# Patient Record
Sex: Male | Born: 1940 | Race: White | Hispanic: No | Marital: Married | Smoking: Former smoker
Health system: Southern US, Community
[De-identification: ages and names within clinical notes are randomized; demographics above are authoritative.]

## PROBLEM LIST (undated history)

## (undated) DIAGNOSIS — C4492 Squamous cell carcinoma of skin, unspecified: Secondary | ICD-10-CM

## (undated) DIAGNOSIS — I4891 Unspecified atrial fibrillation: Secondary | ICD-10-CM

## (undated) DIAGNOSIS — E7800 Pure hypercholesterolemia, unspecified: Secondary | ICD-10-CM

## (undated) DIAGNOSIS — E785 Hyperlipidemia, unspecified: Secondary | ICD-10-CM

## (undated) DIAGNOSIS — T4145XA Adverse effect of unspecified anesthetic, initial encounter: Secondary | ICD-10-CM

## (undated) DIAGNOSIS — T8859XA Other complications of anesthesia, initial encounter: Secondary | ICD-10-CM

## (undated) DIAGNOSIS — I10 Essential (primary) hypertension: Secondary | ICD-10-CM

## (undated) DIAGNOSIS — I350 Nonrheumatic aortic (valve) stenosis: Secondary | ICD-10-CM

## (undated) DIAGNOSIS — G3184 Mild cognitive impairment, so stated: Secondary | ICD-10-CM

## (undated) DIAGNOSIS — B029 Zoster without complications: Secondary | ICD-10-CM

## (undated) HISTORY — DX: Unspecified atrial fibrillation: I48.91

## (undated) HISTORY — PX: KNEE SURGERY: SHX244

## (undated) HISTORY — DX: Squamous cell carcinoma of skin, unspecified: C44.92

## (undated) HISTORY — DX: Pure hypercholesterolemia, unspecified: E78.00

## (undated) HISTORY — DX: Zoster without complications: B02.9

## (undated) HISTORY — DX: Nonrheumatic aortic (valve) stenosis: I35.0

## (undated) HISTORY — PX: OTHER SURGICAL HISTORY: SHX169

## (undated) HISTORY — DX: Hyperlipidemia, unspecified: E78.5

## (undated) HISTORY — DX: Essential (primary) hypertension: I10

## (undated) HISTORY — PX: CARDIAC CATHETERIZATION: SHX172

---

## 1999-03-25 ENCOUNTER — Ambulatory Visit (HOSPITAL_COMMUNITY): Admission: RE | Admit: 1999-03-25 | Discharge: 1999-03-25 | Payer: Self-pay | Admitting: Gastroenterology

## 1999-11-12 ENCOUNTER — Encounter: Payer: Self-pay | Admitting: Orthopedic Surgery

## 1999-11-12 ENCOUNTER — Encounter: Admission: RE | Admit: 1999-11-12 | Discharge: 1999-11-12 | Payer: Self-pay | Admitting: Orthopedic Surgery

## 2004-08-02 ENCOUNTER — Encounter (INDEPENDENT_AMBULATORY_CARE_PROVIDER_SITE_OTHER): Payer: Self-pay | Admitting: Specialist

## 2004-08-02 ENCOUNTER — Ambulatory Visit (HOSPITAL_COMMUNITY): Admission: RE | Admit: 2004-08-02 | Discharge: 2004-08-02 | Payer: Self-pay | Admitting: Gastroenterology

## 2006-07-11 HISTORY — PX: AORTIC VALVE REPLACEMENT: SHX41

## 2007-05-14 ENCOUNTER — Ambulatory Visit (HOSPITAL_COMMUNITY): Admission: RE | Admit: 2007-05-14 | Discharge: 2007-05-14 | Payer: Self-pay | Admitting: Cardiology

## 2007-05-18 ENCOUNTER — Ambulatory Visit: Payer: Self-pay | Admitting: Cardiothoracic Surgery

## 2007-05-21 ENCOUNTER — Encounter: Payer: Self-pay | Admitting: Cardiothoracic Surgery

## 2007-05-23 ENCOUNTER — Encounter: Payer: Self-pay | Admitting: Cardiothoracic Surgery

## 2007-05-23 ENCOUNTER — Inpatient Hospital Stay (HOSPITAL_COMMUNITY): Admission: RE | Admit: 2007-05-23 | Discharge: 2007-05-29 | Payer: Self-pay | Admitting: Cardiothoracic Surgery

## 2007-05-23 ENCOUNTER — Ambulatory Visit: Payer: Self-pay | Admitting: Cardiothoracic Surgery

## 2007-06-29 ENCOUNTER — Ambulatory Visit: Payer: Self-pay | Admitting: Cardiothoracic Surgery

## 2007-06-29 ENCOUNTER — Encounter: Admission: RE | Admit: 2007-06-29 | Discharge: 2007-06-29 | Payer: Self-pay | Admitting: Cardiothoracic Surgery

## 2007-07-13 ENCOUNTER — Encounter (HOSPITAL_COMMUNITY): Admission: RE | Admit: 2007-07-13 | Discharge: 2007-10-11 | Payer: Self-pay | Admitting: Cardiology

## 2009-07-18 IMAGING — CR DG CHEST 1V PORT
2 series · 2 of 2 positions shown · non-contrast
Comparison: 05/24/07.

CLINICAL DATA: Aortic stenosis. 
 PORTABLE CHEST - 1 VIEW ? 05/24/07 AT 2999 HOURS:

[AP (1 of 2)]
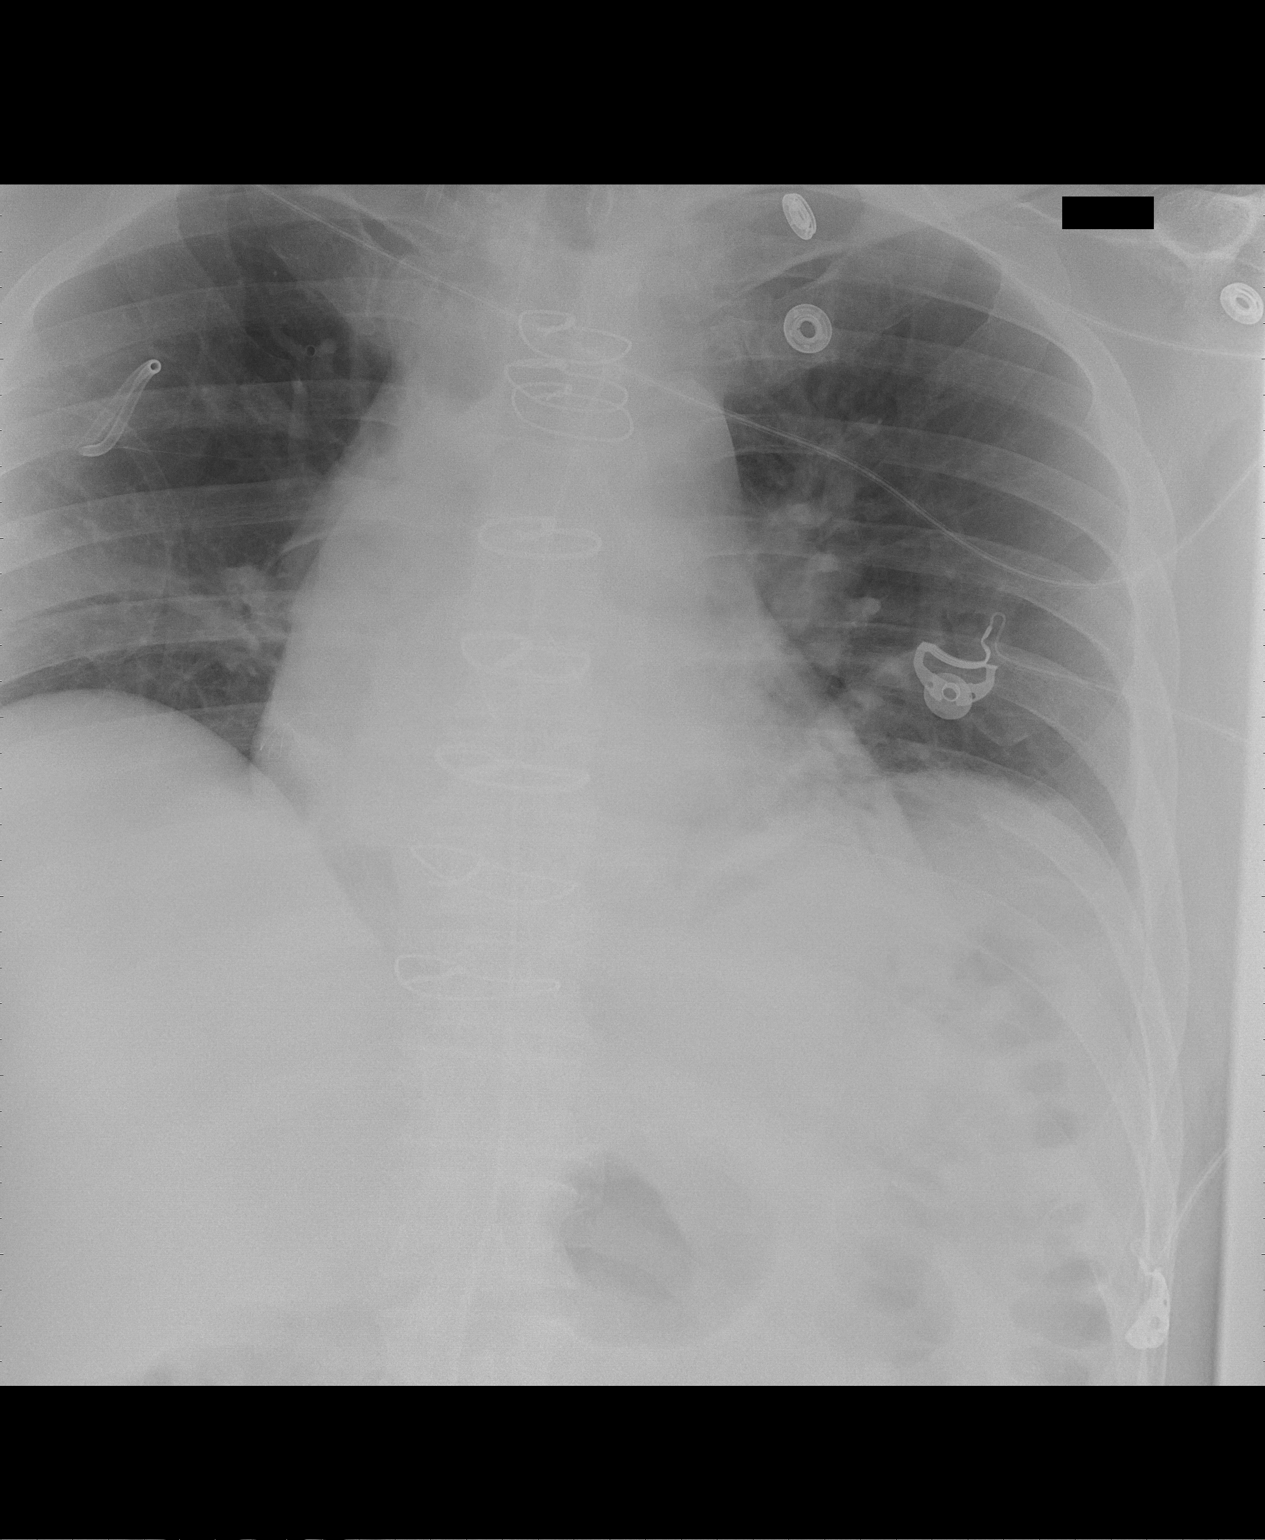

[AP (2 of 2)]
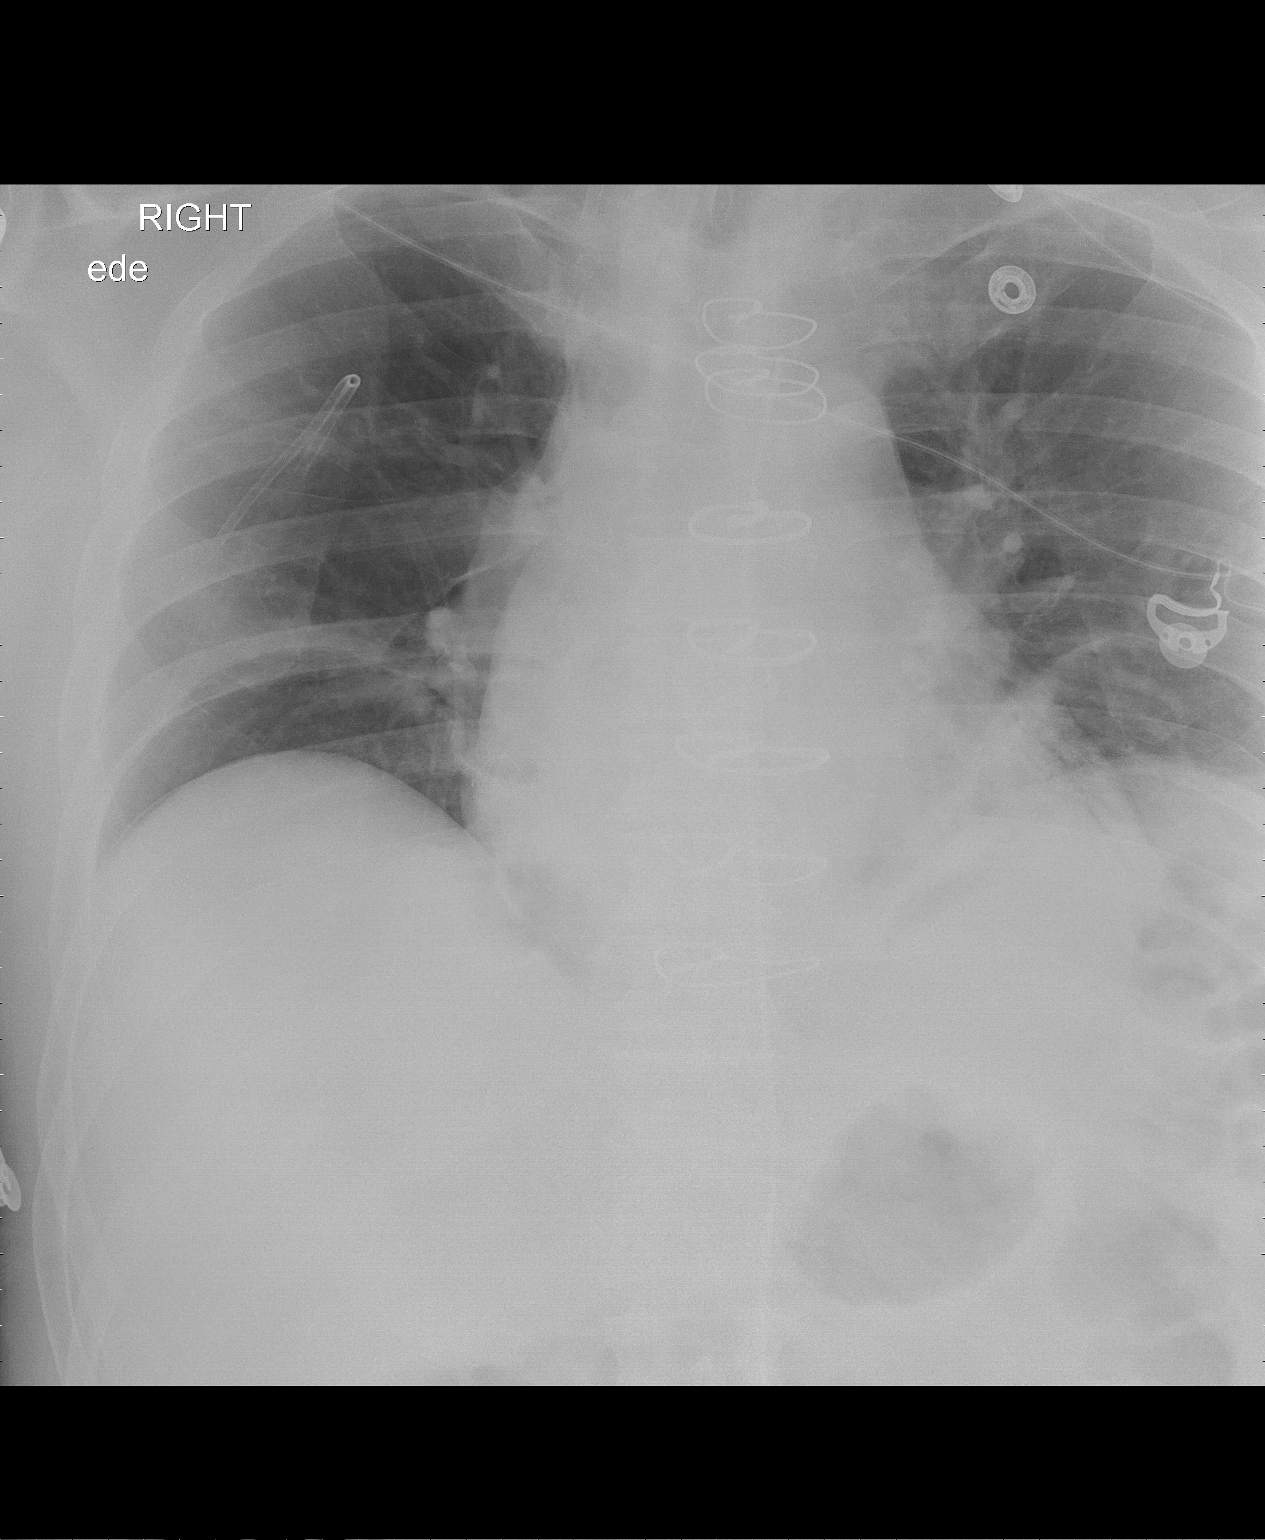

[2 of 2 positions shown; findings below may reference images not displayed]

FINDINGS: A small caliber chest tube has been placed in the right hemithorax into the 2nd intercostal space.  The pneumothorax has nearly resolved.  The lungs remain under inflated with bibasilar atelectasis.  The right IJ vein introducer is stable.  The heart remains prominent due to low lung volumes.    No left pneumothorax.
IMPRESSION: Interval small caliber right chest tube placement with nearly resolved pneumothorax.

## 2009-07-20 IMAGING — CR DG CHEST 1V PORT
1 series · 1 of 1 positions shown · non-contrast
Comparison: 05/25/07.

CLINICAL DATA: Post CABG procedure.
 PORTABLE CHEST ? 1 VIEW ? 05/26/07.

[view not recorded]
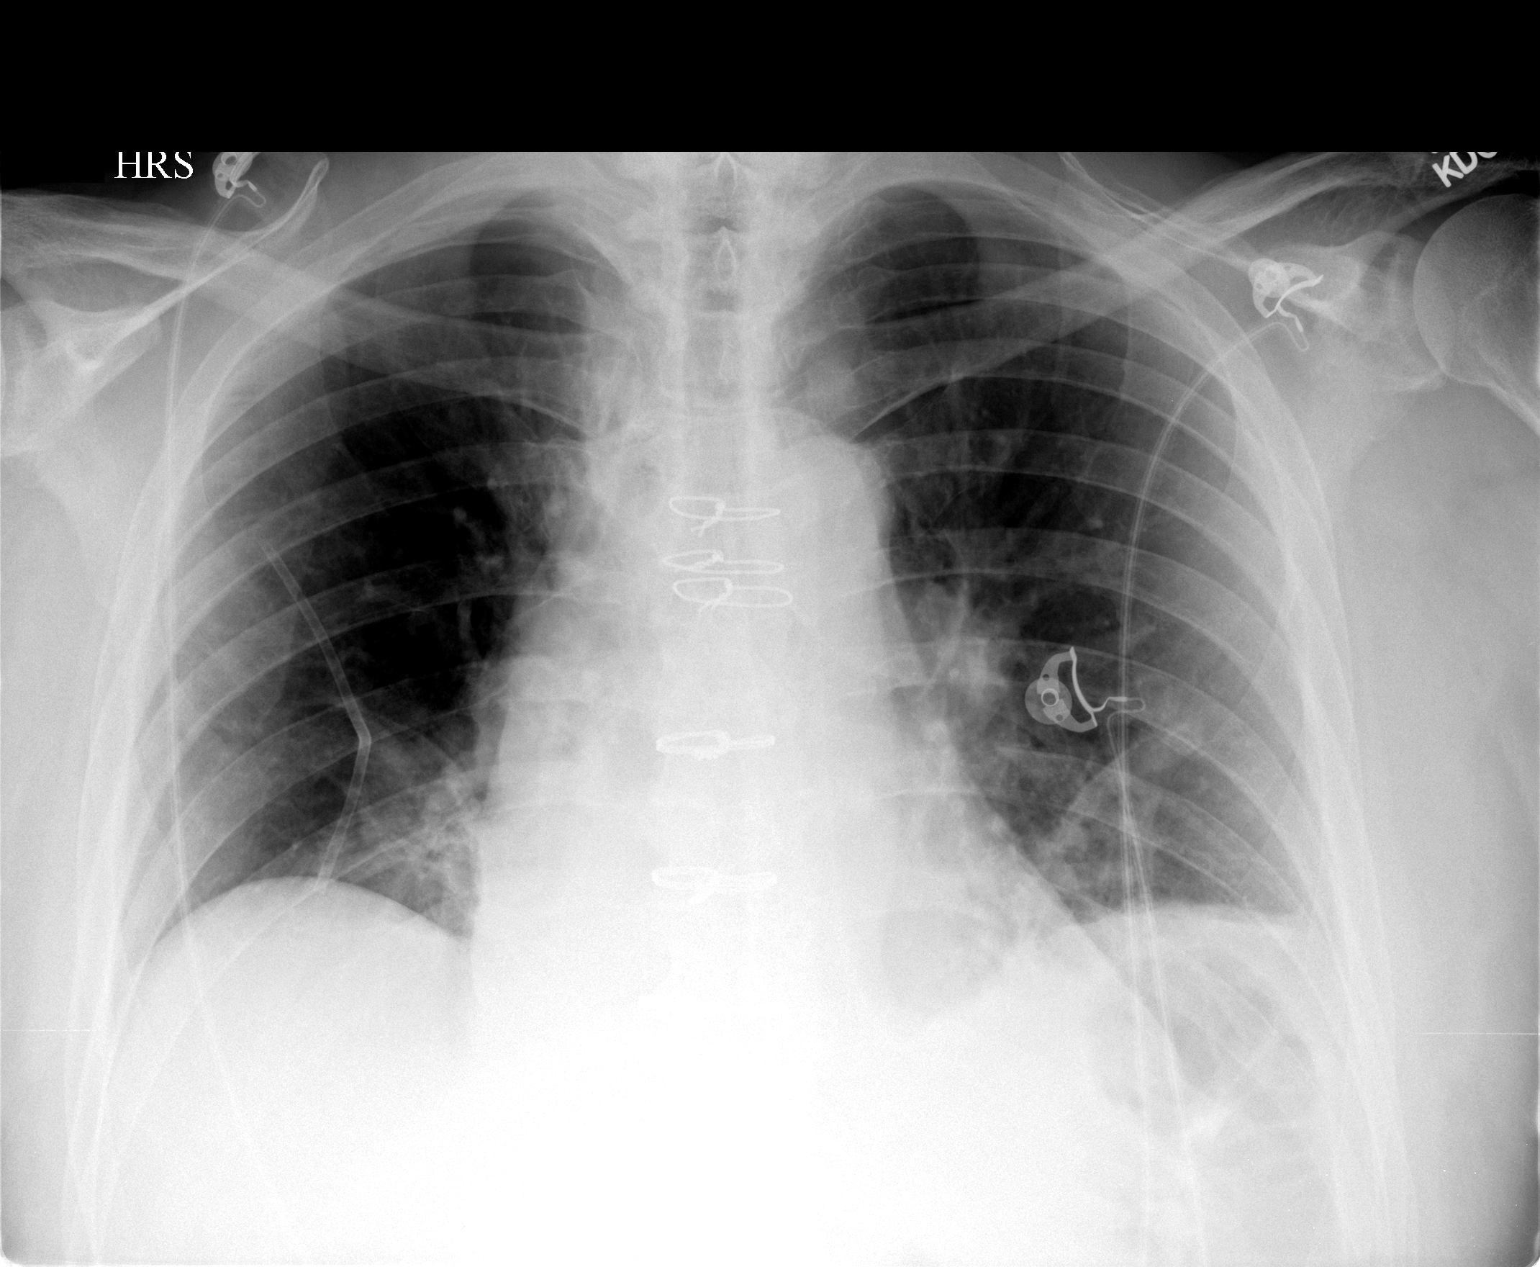

[1 of 1 positions shown; findings below may reference images not displayed]

FINDINGS: Negative for pneumothorax.  Interval improved aeration.  Basilar subsegmental atelectasis is re-demonstrated.  Cardiac lines are unchanged.
IMPRESSION: Negative for pneumothorax.

## 2010-11-23 NOTE — Assessment & Plan Note (Signed)
OFFICE VISIT   Jerome Garrett, Jerome Garrett  DOB:  11/23/1940                                        June 29, 2007  CHART #:  [id number]   HISTORY OF PRESENT ILLNESS:  The patient is status post aortic valve  replacement using pericardial tissue valve on May 23, 2007 by Dr.  Donata Clay. Postoperatively, the patient had a right pneumothorax,  requiring placement of a right chest tube, which resolved. He also had  some postoperative atrial fibrillation and was started on amiodarone and  Coumadin. The patient stabilized and was able to be discharged to home.  He presents today for his followup appointment. The patient does still  have some mild sternal incisional pain but overall, progressing well. He  denies any shortness of breath, nausea and vomiting, or incisional  drainage. He has been seen by Dr. Amil Amen' PA, who has continued him on  all of his medications. He does have an appointment to see Dr. Amil Amen  himself on July 23, 2007. The patient states that he has been  contacted by cardiac rehabilitation and would like to start this as soon  as possible. He is ambulating 3 to 4 times a day and continues to use  his incentive spirometry. The patient did have his amiodarone decreased  to 200 mg daily.   PHYSICAL EXAMINATION:  VITAL SIGNS:  Blood pressure 143/89, pulse 80,  respiratory rate 18, O2 sat 96% on room air.  LUNGS:  Clear to auscultation bilaterally.  CARDIAC:  Regular rate and rhythm.  ABDOMEN:  Bowel sounds x4. Soft, nontender.  EXTREMITIES:  No edema or cyanosis noted. Appeared warm to touch. All  incisions are clean, dry, intact, and healing well.   DIAGNOSTIC STUDIES:  The patient had a chest x-ray on June 29, 2007  showing to be clear with no acute findings. No pneumothorax noted.   IMPRESSION/PLAN:  The patient is status post aortic valve replacement.  He is seen and evaluated by Dr. Donata Clay. The patient did have some  postoperative atrial fibrillation, which he was started on Coumadin and  amiodarone for. The patient noted to be in normal sinus rhythm today.  Discussed with the patient. The patient told that he is to stop taking  Coumadin today but he is to continue taking his amiodarone until Dr.  Amil Amen discontinues it. The patient will also continue taking his  Crestor, Toprol and aspirin daily. He is to continue ambulating 3 to 4  times per day. He is to continue using his incentive spirometry. The  patient is instructed to start cardiac rehabilitation if he would like,  as soon as possible. The patient noted to continue to progress well. It  is felt that he is doing well and does not need to return for followup  in our office. We will release patient from our office and he is to  followup with Dr. Amil Amen in January. He is to contact us with any  questions. The patient acknowledges understanding.   Kerin Perna, M.D.  Electronically Signed   KMD/MEDQ  D:  06/29/2007  T:  06/30/2007  Job:  [id number]   cc:   Francisca December, M.D.  Kerin Perna, M.D.

## 2010-11-23 NOTE — Discharge Summary (Signed)
NAME:  Jerome Garrett, Jerome Garrett NO.:  [id number]   MEDICAL RECORD NO.:  [id number]          PATIENT TYPE:  INP   LOCATION:  2006                         FACILITY:  [facility]   PHYSICIAN:  Kerin Perna, M.D.  DATE OF BIRTH:  04/25/1941   DATE OF ADMISSION:  05/23/2007  DATE OF DISCHARGE:  05/29/2007                               DISCHARGE SUMMARY   HISTORY OF PRESENT ILLNESS:  The patient is a 70 year old white male  referred to Dr. Donata Clay for possible aortic valve replacement, for  recently diagnosed severe aortic stenosis with an aortic valve area of  0.7-0.8.  The patient was found to have a cardiac murmur 3 years ago and  has been followed with serial 2-D echocardiograms.  This has shown mild  to moderate aortic stenosis.  Recently the patient had 2 episodes of  exertional presyncope.  The recent echocardiogram showed good left  ventricular function with a fairly tight aortic stenosis, with a valve  area of less than 1.0 and left ventricular hypertrophy.  The ascending  aorta did not show dilatation.  The patient underwent a cardiac  catheterization by Dr. Amil Amen, which demonstrated insignificant  coronary artery disease with normal PA pressures.  A transseptal  approach was used to perform the ventriculogram, and the aortic valve  area was 0.7-0.8 with a cardiac output of 4.4 liters.  The ejection  fraction was 70% and a peak-to-peak gradient was 80 mmHg with a mean  gradient of 68 mmHg.   Based on the results of the catheterization and the symptoms, he was  felt to be a candidate for aortic valve replacement.  Dr. Donata Clay  evaluated the patient and his studies, and recommended proceeding.  He  was admitted this hospitalization for the procedure.   PAST MEDICAL HISTORY:  1. Mild hypertension.  2. Mild hyperlipidemia.  3. Status post colon polypectomy.   ALLERGIES:  NO KNOWN DRUG ALLERGIES.   MEDICATIONS:  Prior to admission none.   FAMILY HISTORY, SOCIAL  HISTORY, REVIEW OF SYMPTOMS AND PHYSICAL  EXAMINATION:  Please see history and physical done at the time of admission.   HOSPITAL COURSE:  The patient was admitted electively on May 23, 2007; taken to the operating room, at which time he underwent the  following procedure: Aortic valve replacement with a number 23-mm  Edwards Magna valve.  The patient tolerated the procedure well and was  taken to the surgical intensive care unit in stable condition.   POSTOPERATIVE HOSPITAL COURSE:  The patient has done well.  All routine  lines, monitors and drainage devices were discontinued in a standard  fashion.  The patient did initially have a 30% pneumothorax requiring a  Heimlich valve, but this was discontinued with full resolution of the  pneumothorax on postoperative day  #1.  The patient did have a moderate postoperative anemia; this did  stabilize.  Most recent hemoglobin and hematocrit dated May 26, 2007 were 8.7 and 25.1.  The patient was weaned from oxygen without  difficulty.  The patient did have episode of postoperative atrial  fibrillation.  This was intermittent, and he was placed on amiodarone as  well as beta blocker.  The patient's incision was healing well without  evidence of infection.  He was tolerating routine advancement activity  using standard protocols.  His pain was under adequate control.  His  overall status was felt to be stable for discharge on May 29, 2007.  It is noted at that time the patient was therapeutic on his Coumadin,  but still in atrial fibrillation.   MEDICATIONS AT TIME OF DISCHARGE:  1. Aspirin 81 mg daily.  2. Crestor 20 mg daily.  3. Coumadin 2.5 mg daily.  4. Amiodarone 400 mg twice a day for 5 days, then 200 mg twice daily.  5. Metoprolol 25 mg twice daily.  6. For pain, oxycodone 5 mg one to two every 4-6 hours as needed.   FOLLOW-UP:  Included the Coumadin Management through Dr. Amil Amen office,  with first appointment  May 31, 2007.  Additionally, he had an  appointment to see Dr. Amil Amen on June 30, 2007 at 2:15; Dr. Donata Clay 3 weeks post discharge, with the office to call with this  appointment.   INSTRUCTIONS:  The patient received written instructions regarding  medications, activity, diet, wound care and follow-up.   FINAL DIAGNOSES:  1. Severe aortic stenosis, status post aortic valve replacement --as      described above.  2. Postoperative atrial fibrillation.  3. Postoperative anemia.  4. Mild hypertension.  5. Mild hyperlipidemia.  6. Previous history of colon polypectomy.   CONDITION ON DISCHARGE:  Stable and improving.      Rowe Clack, P.A.-C.      Kerin Perna, M.D.  Electronically Signed    WEG/MEDQ  D:  07/07/2007  T:  07/08/2007  Job:  [id number]   cc:   Danise Edge, M.D.  Francisca December, M.D.

## 2010-11-23 NOTE — Op Note (Signed)
NAME:  Jerome Garrett, Jerome Garrett NO.:  [id number]   MEDICAL RECORD NO.:  [id number]          PATIENT TYPE:  INP   LOCATION:  2305                         FACILITY:  [facility]   PHYSICIAN:  Kerin Perna, M.D.  DATE OF BIRTH:  Sep 17, 1940   DATE OF PROCEDURE:  05/23/2007  DATE OF DISCHARGE:                               OPERATIVE REPORT   OPERATION:  Aortic valve replacement (AVR with a pericardial tissue  valve,  Edwards model 3000, serial number [id number], 23 mm size).   SURGEON:  Kerin Perna, M.D.   PREOPERATIVE DIAGNOSES:  Critical aortic stenosis.   POSTOPERATIVE DIAGNOSES:  Critical aortic stenosis.   ANESTHESIA:  General by Dr. Adonis Huguenin.   INDICATIONS:  The patient is a 70 year old male who presents with  decreasing exercise tolerance and exertional near syncope.  He has a  murmur of aortic stenosis followed by serial 2-D echoes.  A recent echo  showed his transvalvular gradient in excess of 70 mmHg.  He  susbsequently underwent cardiac catheterization by Dr. Amil Amen which  demonstrated an aortic valve area of 0.7 and insignificant coronary  disease with LVH.  He is felt to be candidate for aortic valve  replacement.   Prior to surgery, I examined the patient in the office and reviewed the  results of his cardiac cath and echo.  I discussed the indications and  expected benefits of aortic valve replacement for treatment of his  aortic stenosis.  I discussed the options of a mechanical versus a  tissue or bioprosthetic valve and it was my recommendation and his  agreement that we proceed with a tissue valve replacement. I also  discussed the major issues of the surgery including the use of general  anesthesia and cardiopulmonary bypass, the location of the surgical  incisions, and the expected postoperative hospital recovery.  I  discussed with the patient the risks to him of this operation including  the risks of MI, CVA, bleeding, blood transfusion  requirement,  infection, and death.  After reviewing these issues, he demonstrated his  understanding and agreed to proceed with the operation as planned under  what I felt was an informed consent.   OPERATIVE FINDINGS:  The patient had a tricuspid aortic valve which was  heavily calcified and thickened.  The transesophageal echo post pump  showed good functioning of the new bioprosthetic valve and good LV  function.  The patient did not require any packed cells but did receive  1 unit of platelets for a diffuse coagulopathy after reversal of heparin  with protamine.   DESCRIPTION OF PROCEDURE:  The patient was brought to the operating room  and placed supine on the operating table where general anesthesia was  induced.  The transesophageal 2-D echo was placed by the  anesthesiologist and confirmed the preoperative diagnosis of severe  aortic stenosis. The chest, abdomen and legs were prepped with Betadine  and draped as a sterile field.  A sternal incision was made. The sternum  was retracted and the pericardium was opened and suspended.  Pursestring  were placed in the ascending aorta  and right atrium.  Heparin was  administered and the ACT was documented as being therapeutic.  The  patient was cannulated and placed on bypass.  The heart was noted to be  hypertrophied.  The coronaries did not have any significant  calcification except for the distal third of the LAD at a diagonal  branch which had a calcified plaque.  By arteriography this was not  significantly stenotic. Cardioplegia catheters were placed for both  antegrade and retrograde cardioplegia and a left ventricular vent was  placed via the right superior pulmonary vein.  The patient's body  temperature was cooled to 32 degrees and aortic crossclamp was applied.  1 liter of cold blood cardioplegia was delivered in split doses between  the antegrade aortic and retrograde coronary sinus catheters.  There was  good  cardioplegic arrest and septal temperature dropped less than 15  degrees. Cardioplegia was delivered every 20 minutes while the  crossclamp was placed via the retrograde catheter.   A transverse aortotomy was performed.  The aortic valve was inspected.  It was heavily calcified and thickened.  It was excised and the annulus  was debrided of extensive calcification.  The outflow tract was  irrigated with copious amounts of cold saline.  The annulus was sized to  a 23 mm magna Edwards pericardial valve.  The valve was prepared and  rinsed per protocol.   2-0 subannular pledgeted Ethibond sutures were placed around the annulus  numbering 19.  When the valve was processed, the sutures were placed  with the sewing ring of the valve, the valve was seated and the sutures  were tied.  There is good confirmation of the valve to the annulus and  no evidence of spaces.  There is no obstruction of the coronary ostia.  After the valve was again rinsed and irrigated, the aortotomy was closed  in layers using running 4-0 Prolene.  The patient was then rewarmed and  a dose of retrograde warm blood cardioplegia was performed with the  usual de-airing maneuvers with the patient in a steep Trendelenburg  position.  The aortic crossclamp was then removed.   The patient was rewarmed and reperfused.  Temporary pacing wires were  applied.  The heart resumed a spontaneous rhythm.  The lungs re-expanded  and the ventilator was resumed.  When the patient had been adequately  reperfused and rewarmed, he was weaned off bypass being AV sequentially  paced.  Blood pressure and cardiac output were stable.  The  transesophageal echo showed good valve function without aortic  insufficiency and good LV function.  Protamine was administered without  adverse reaction.  The cannula was removed.  The mediastinum was  irrigated with warm antibiotic irrigation.  The superior pericardial fat  was closed.  Two mediastinal  chest tubes were placed and brought out  through separate incisions.  The sternum was closed with interrupted  steel wire.  The pectoralis fascia was closed with a running #1 Vicryl.  The subcutaneous and skin layers were closed with a running Vicryl and  sterile dressings were applied.  Total bypass time was 140 minutes with  crossclamp time of 100 minutes.      Kerin Perna, M.D.  Electronically Signed     PV/MEDQ  D:  05/23/2007  T:  05/24/2007  Job:  [id number]   cc:   Francisca December, M.D.  [facility]

## 2010-11-23 NOTE — Op Note (Signed)
NAME:  FLINT, HAKEEM NO.:  [id number]   MEDICAL RECORD NO.:  [id number]          PATIENT TYPE:  INP   LOCATION:  2305                         FACILITY:  [facility]   PHYSICIAN:  Kerin Perna, M.D.  DATE OF BIRTH:  11/09/1940   DATE OF PROCEDURE:  05/24/2007  DATE OF DISCHARGE:                               OPERATIVE REPORT   PROCEDURE:  Right chest tube placement.   PREOPERATIVE DIAGNOSIS:  Right pneumothorax.   POSTOPERATIVE DIAGNOSIS:  Right pneumothorax.   SURGEON:  Kerin Perna, M.D.   ANESTHESIA:  Local 1% lidocaine.   INDICATIONS FOR PROCEDURE:  The patient is a 70 year old male who  underwent aortic valve replacement 24 hours previously.  His postop day  1 chest x-ray showed a 30% right pneumothorax which did not improve over  time.  For that reason a right chest tube was recommended.   PROCEDURE IN DETAIL:  The patient was given informed consent and  premedicated with morphine and Versed.  The right anterior chest was  prepped and draped as a sterile field.  Using sterile mask, glove and  gown technique a Heimlich valve catheter was inserted into the third  interspace anteriorly and connected to an underwater seal Pleur-Evac  drainage system.  This was secured to the skin with a zero silk suture  and a sterile dressing was applied.  The patient tolerated the procedure  without complications.      Kerin Perna, M.D.  Electronically Signed     PV/MEDQ  D:  05/24/2007  T:  05/25/2007  Job:  [id number]

## 2010-11-23 NOTE — Cardiovascular Report (Signed)
NAME:  Jerome Garrett, Jerome Garrett NO.:  [id number]   MEDICAL RECORD NO.:  [id number]          PATIENT TYPE:  OIB   LOCATION:  2899                         FACILITY:  [facility]   PHYSICIAN:  Francisca December, M.D.  DATE OF BIRTH:  11-07-1940   DATE OF PROCEDURE:  DATE OF DISCHARGE:                            CARDIAC CATHETERIZATION   PROCEDURES PERFORMED:  1. Right and transseptal left heart catheterization.  2. Left ventriculogram.  3. Coronary angiography.   INDICATIONS:  Mr. Sue Mcalexander is a 70 year old man with known severe  to critical aortic stenosis.  He has normally developed episodes of  presyncope.  A recent echocardiogram has confirmed the presence of  critical aortic stenosis and normal LV systolic function.  However,  there is some motion of the aortic valve leaflets which is unusual with  this degree of stenosis.  He will therefore undergo right and  transseptal left heart catheterization to confirm the degree of stenosis  and coronary angiography to assess for possible CAD.   PROCEDURE NOTE:  The patient is brought to cardiac catheterization  laboratory in fasting state.  The right groin was prepped and draped in  the usual sterile fashion.  Local anesthesia was obtained with  infiltration of 1% lidocaine.  An 8-French catheter sheath was inserted  percutaneously into the right femoral vein utilizing an anterior  approach over a guiding J-wire.  In a  similar fashion, a  6-French  catheter sheath was inserted into the right femoral artery.  A 7.5  French balloon flow-directed catheter was then advanced through the  right heart chambers to the pulmonary capillary wedge position.  Pressure was recorded with the catheter in the pulmonary capillary wedge  position, the main pulmonary artery, the right ventricle, and the right  atrium.  Blood samples for oxygen saturation determination were obtained  from the superior vena cava and the main pulmonary artery.  The  Theone Murdoch catheter was then removed and a 6-French 110  cm pigtail  catheter  was advanced to the ascending aorta and positioned in the posterior cusp  of the aortic valve.  A blood sample for oxygen saturation determination  was obtained and aortic pressures were recorded.  The 8-French catheter  sheath was then removed over a 0.025 inch guidewire which had been  advanced into the superior vena cava.  Over this guidewire the 8-French  Mullins sheath and dilator were advanced into the superior vena cava  where the dilator was withdrawn to within the sheath.  The guidewire was  removed.  The Brockenbrough needle was then advanced to the tip of the  dilator and the sheath withdrawn onto the dilator.  Then using  fluoroscopic landmarks in the AP and lateral projections the Mullins  sheath and dilator were withdrawn into the right atrium.  When the  device was adequately positioned midway between the aortic valve and the  spine fluoroscopically and facing posteriorly the Brockenbrough needle  was exposed.  This was advanced slightly until the septum was crossed  and left atrial pressure was observed.  I then advanced the entire  apparatus while infusing contrast in the left atrium observing free flow  throughout.  The Brockenbrough needle was then removed as the [facility]  sheath and dilator were advanced.  Eventually the dilator was removed  and the [facility] sheath remained in the left atrium.  This was copiously  irrigated and left atrial pressure measured.  At this point a 7-French  Berman  angiographic catheter was then advanced through the [facility]  sheath, the balloon inflated and this advanced across the mitral valve.  Pressure recordings were then obtained after flushing and calibrating  both the aortic and left ventricular catheters.  These pressures were  obtained simultaneously.   I then proceeded with left ventriculography.  The [facility] angiographic  balloon catheter remained  inflated throughout the angiogram.  40 mL were  injected at 13 mL per second utilizing a power injector.  This was done  in the 30 degrees RAO angulation.  The balloon was then deflated and  pressure recorded as the catheter was withdrawn across the mitral valve  and the left atrium and then across the septum into the right atrium.  The [facility] angiographic catheter was removed and the [facility] sheath was  positioned in the inferior vena cava.  The pigtail catheter was then  exchanged for a 6-French #4 left Judkins catheter.  Cineangiography of  left coronary artery was conducted in LAO and RAO projections.  The left  Judkins catheter was then exchanged for a 6-French #4 right Judkins  catheter.  Cineangiography of the right coronary was conducted in LAO  and RAO projections.  Following the coronary angiography, the right  Judkins catheter was removed and a right femoral arteriogram was  performed in the 45 degree RAO angulation via the catheter sheath by  hand injection.  This documented adequate anatomy for placement of the  percutaneous closure device Angio-Seal.  This was subsequent deployed  with good hemostasis and an intact distal pulse.  The Mullins sheath was  also removed after Angio-Seal deployment and pressure held for 5-7  minutes.  The patient was transported to the recovery area in stable  condition.   HEMODYNAMIC RESULTS:  Utilizing the Fick principle and an estimated  oxygen consumption of 272 mL per minute, the calculated cardiac output  was 4.4 liters per minute and index 1.9 liters per minute per meter  squared.  Utilizing the thermodilution method the cardiac output was 6.5  meters and index 2.8 liters per minute per meter squared.  Right heart  pressures were as follows:  Right atrial pressure was A-wave 18 mmHg, V-  wave 5 mmHg, mean 4 mmHg.  Right ventricular pressure was 32/6 mmHg.  Pulmonary artery pressure was 30/13 mmHg with a mean of 19 mmHg.  Pulmonary  capillary wedge pressure was 16 mmHg A-wave, 13 mmHg V-wave  and mean of 12 mmHg.  Systemic arterial pressure was 134/83 with a mean  of 105 mmHg.  Left atrial pressure was A-wave of 13 mmHg, V-wave of 19  mmHg, mean of 8 mmHg.  The peak-to-peak transaortic valve gradient was  85 mmHg.  The mean gradient was 68 mmHg.  Utilizing the Gorlin formula  and the Fick cardiac output, the calculated aortic valve area was 0.55  cm2, utilizing the thermodilution cardiac output the cardiac the aortic  valve area was 8.3 cm2.   ANGIOGRAPHY:  The left ventriculogram demonstrated normal chamber size  and normal global systolic function without regional wall motion  abnormality.  A visual estimate of the ejection fraction  is 75%.  There  does appear to be moderate concentric hypertrophy.  There is heavy  calcification of the aortic valve with some leaflet movement.  This was  during systole.  There is no significant mitral regurgitation.  There is  no coronary artery calcification.   There is a right-dominant coronary system present.  The main left  coronary artery is normal.   The left anterior descending artery demonstrates a 30% tubular narrowing  at the origin of the first diagonal branch.  Two diagonal branches  arise.  The first is moderate in size.  The second is small.  There are  no obstructions in the diagonals nor is there any obstruction in the  ongoing anterior descending artery which reaches and barely traverses  the apex.   The left circumflex coronary artery and its branches show some luminal  irregularities but are without any significant obstruction.  A small  ramus intermedius arises and then a large to moderate size branching  first marginal branch.  There is a moderate-sized second marginal branch  and then a very small third marginal branch.  No obstructions of any  significance are seen within these vessels.   The right coronary artery and its branches are widely patent.   There are  some luminal irregularities again in the proximal and midportion.  The  distal portion is free of any obstruction and it gives rise to a large  posterior descending artery, a large posterolateral segment, and one  large left ventricular branch.  There are no obstructions in these  distal vessels.   FINAL IMPRESSION:  1. Normal right heart pressures.  2. Normal cardiac output was some disparity between Fick and      thermodilution outputs as noted.  3. Critical aortic stenosis, both by Fick cardiac output and by      thermodilution cardiac output as described above.  4. Intact left ventricular size and global systolic function, ejection      fraction 75%.  5. Minimal but nonobstructive atherosclerotic coronary vascular      disease.   PLAN/RECOMMENDATIONS:  The patient will be referred for prompt  consideration of aortic valve replacement.      Francisca December, M.D.  Electronically Signed     JHE/MEDQ  D:  05/14/2007  T:  05/15/2007  Job:  [id number]   cc:   Danise Edge, M.D.

## 2010-11-23 NOTE — Consult Note (Signed)
NEW PATIENT CONSULTATION   Jerome Garrett, Jerome Garrett  DOB:  [birth year]                                        May 18, 2007  CHART #:  [id number]   PRIMARY CARE PHYSICIAN:  Danise Edge, M.D.   REASON FOR CONSULTATION:  Aortic stenosis.   CHIEF COMPLAINT:  Pre-syncope and cardiac murmur.   HISTORY OF PRESENT ILLNESS:  I was asked to evaluate this 70 year old  white male for possible aortic valve replacement for recently-diagnosed  severe aortic stenosis with an aortic valve area of 0.7 to 0.8.  the  patient was found to have a cardiac murmur three years ago and has been  followed by serial 2D echoes.  This has shown mild to moderate aortic  stenosis.  Recently, the patient had two episodes of exertional  presyncope.  His recent echo showed good LV function with fairly tight  aortic stenosis with a valve area of less than 1 and LVH.  The ascending  aorta did not show dilatation.  The patient underwent cardiac  catheterization earlier this week by Dr. Amil Amen, which demonstrated  insignificant coronary artery disease and normal PA pressures.  A trans-  septal approach was used to perform the ventriculogram and the aortic  valve area was 0.7 to 0.8 with a cardiac output of 4.4 liters.  The EF  was 70% and the peak-to-peak gradient was 80 mmHg with a mean gradient  of 68 mmHg.  Based on the results of the cath and the symptoms, it was  felt aortic valve replacement would be indicated.   PAST MEDICAL HISTORY:  1. Mild hypertension.  2. Mild hyperlipidemia.  3. Status post colonoscopic colon polypectomy.  4. No known drug allergies.   HOME MEDICATIONS:  None.   SOCIAL HISTORY:  The patient is a retired Scientist, research (life sciences), lives with his wife  and his children and grandchildren are in the area.  He does not smoke,  drinks one or two beers daily.   FAMILY HISTORY:  No history of heart valve disorder.  One brother has  had heart bypass surgery.   REVIEW OF SYSTEMS:  He  denies any history of rheumatic fever as a child.  There is no history of long-standing murmur or bicuspid aortic valve.  He has had no restriction in his activities over the years.  He denies  any serious traumatic injuries to the chest or peripheral extremities.  He denies endocrine problems, diabetes or thyroid disease.  He denies  vascular problems, claudication or DVT.  His only operative procedures  have been for colonoscopy.  He saw his dentist within the past three  months and has no active dental problems.   PHYSICAL EXAM:  The patient is 6 feet 2 and weighs 225 pounds.  Blood  pressure 150/90, pulse 80, respirations 18, saturation 97%.  He is alert  and pleasant.  HEENT exam was normocephalic.  The neck is without JVD,  mass or bruit.  He does have transmitted murmur of aortic stenosis in  the neck.  Lymphatics:  No palpable supraclavicular or cervical  adenopathy.  Breath sounds are clear and the thorax is without  deformity.  Cardiac exam reveals a loud 4/6 systolic ejection murmur,  radiating to the neck.  There is no diastolic component.  There is no S3  gallop.  Abdominal exam is soft,  nontender, without pulsatile mass.  Extremities reveal no clubbing, cyanosis or edema.  Peripheral pulses  are 2+ in all extremities.  Neurologic exam is alert and oriented,  without focal motor deficit.  Skin is without rash or lesion.   LABORATORY DATA:  I reviewed the cardiac catheterization and recent 2D  echo and the patient has severe aortic stenosis with preserved LV  systolic function, normal PA pressures and ejection fraction of 70%.  There is mild mitral annular calcification, loud regurgitation and mild  aortic insufficiency.  He has insignificant coronary disease with a left  dominant coronary system.   Patient will be scheduled for aortic valve replacement with a  bioprosthetic valve to which he agrees.  Surgery will be scheduled for  November 12.  I discussed the alternatives  of surgery, as well as the  details of surgery, including the associated risks of bleeding,  arrhythmia, stroke and death.  He understands and agrees to proceed.   Thank you very much for the consultation.   Kerin Perna, M.D.  Electronically Signed   PV/MEDQ  D:  05/18/2007  T:  05/18/2007  Job:  [id number]   cc:   Danise Edge, M.D.  [facility]  T[facility]

## 2011-04-19 LAB — POCT I-STAT 4, (NA,K, GLUC, HGB,HCT)
Glucose, Bld: 100 — ABNORMAL HIGH
Glucose, Bld: 103 — ABNORMAL HIGH
Glucose, Bld: 112 — ABNORMAL HIGH
Glucose, Bld: 137 — ABNORMAL HIGH
Glucose, Bld: 78
Glucose, Bld: 86
HCT: 25 — ABNORMAL LOW
HCT: 26 — ABNORMAL LOW
HCT: 27 — ABNORMAL LOW
HCT: 27 — ABNORMAL LOW
HCT: 27 — ABNORMAL LOW
HCT: 40
Hemoglobin: 13.6
Hemoglobin: 8.5 — ABNORMAL LOW
Hemoglobin: 8.8 — ABNORMAL LOW
Hemoglobin: 9.2 — ABNORMAL LOW
Hemoglobin: 9.2 — ABNORMAL LOW
Hemoglobin: 9.2 — ABNORMAL LOW
Operator id: 140821
Operator id: 300131
Operator id: 3406
Operator id: 3406
Operator id: 3406
Operator id: 3406
Potassium: 3.8
Potassium: 3.9
Potassium: 4.3
Potassium: 4.4
Potassium: 5.1
Potassium: 5.6 — ABNORMAL HIGH
Sodium: 133 — ABNORMAL LOW
Sodium: 134 — ABNORMAL LOW
Sodium: 135
Sodium: 137
Sodium: 140
Sodium: 141

## 2011-04-19 LAB — POCT I-STAT 3, ART BLOOD GAS (G3+)
Acid-base deficit: 2
Acid-base deficit: 2
Acid-base deficit: 3 — ABNORMAL HIGH
Acid-base deficit: 4 — ABNORMAL HIGH
Acid-base deficit: 5 — ABNORMAL HIGH
Bicarbonate: 19.5 — ABNORMAL LOW
Bicarbonate: 21.1
Bicarbonate: 21.4
Bicarbonate: 21.8
Bicarbonate: 23.6
O2 Saturation: 100
O2 Saturation: 100
O2 Saturation: 97
O2 Saturation: 98
O2 Saturation: 99
Operator id: 300131
Operator id: 300131
Operator id: 3406
Operator id: 3406
Patient temperature: 36.3
Patient temperature: 36.8
TCO2: 21
TCO2: 22
TCO2: 23
TCO2: 24
TCO2: 25
pCO2 arterial: 32.8 — ABNORMAL LOW
pCO2 arterial: 33.5 — ABNORMAL LOW
pCO2 arterial: 34.5 — ABNORMAL LOW
pCO2 arterial: 37.7
pCO2 arterial: 42.5
pH, Arterial: 7.352
pH, Arterial: 7.359
pH, Arterial: 7.381
pH, Arterial: 7.394
pH, Arterial: 7.401
pO2, Arterial: 103 — ABNORMAL HIGH
pO2, Arterial: 123 — ABNORMAL HIGH
pO2, Arterial: 246 — ABNORMAL HIGH
pO2, Arterial: 328 — ABNORMAL HIGH
pO2, Arterial: 75 — ABNORMAL LOW

## 2011-04-19 LAB — I-STAT EC8
Acid-base deficit: 5 — ABNORMAL HIGH
BUN: 10
Bicarbonate: 19.7 — ABNORMAL LOW
Chloride: 109
Glucose, Bld: 104 — ABNORMAL HIGH
HCT: 26 — ABNORMAL LOW
Hemoglobin: 8.8 — ABNORMAL LOW
Operator id: 273681
Potassium: 4.3
Sodium: 141
TCO2: 21
pCO2 arterial: 34.2 — ABNORMAL LOW
pH, Arterial: 7.369

## 2011-04-19 LAB — BLOOD GAS, ARTERIAL
Acid-base deficit: 0.9
Bicarbonate: 22.9
Drawn by: 181601
FIO2: 0.21
O2 Saturation: 97
Patient temperature: 98.6
TCO2: 24
pCO2 arterial: 35.7
pH, Arterial: 7.423
pO2, Arterial: 84.1

## 2011-04-19 LAB — PREPARE PLATELET PHERESIS

## 2011-04-19 LAB — URINALYSIS, ROUTINE W REFLEX MICROSCOPIC
Bilirubin Urine: NEGATIVE
Glucose, UA: NEGATIVE
Hgb urine dipstick: NEGATIVE
Ketones, ur: NEGATIVE
Nitrite: NEGATIVE
Protein, ur: NEGATIVE
Specific Gravity, Urine: 1.027
Urobilinogen, UA: 1
pH: 5.5

## 2011-04-19 LAB — BASIC METABOLIC PANEL
BUN: 14
BUN: 15
BUN: 11
BUN: 14
CO2: 25
CO2: 27
CO2: 22
CO2: 23
Calcium: 8.1 — ABNORMAL LOW
Calcium: 8.3 — ABNORMAL LOW
Calcium: 7.7 — ABNORMAL LOW
Calcium: 7.7 — ABNORMAL LOW
Chloride: 101
Chloride: 105
Chloride: 103
Chloride: 108
Creatinine, Ser: 1.18
Creatinine, Ser: 1.24
Creatinine, Ser: 1.03
Creatinine, Ser: 1.15
GFR calc Af Amer: >60
GFR calc Af Amer: >60
GFR calc Af Amer: >60
GFR calc Af Amer: >60
GFR calc non Af Amer: 59 — ABNORMAL LOW
GFR calc non Af Amer: >60
GFR calc non Af Amer: >60
GFR calc non Af Amer: >60
Glucose, Bld: 104 — ABNORMAL HIGH
Glucose, Bld: 104 — ABNORMAL HIGH
Glucose, Bld: 103 — ABNORMAL HIGH
Glucose, Bld: 129 — ABNORMAL HIGH
Potassium: 3.5
Potassium: 3.8
Potassium: 4.1
Potassium: 4.3
Sodium: 135
Sodium: 137
Sodium: 134 — ABNORMAL LOW
Sodium: 137

## 2011-04-19 LAB — APTT
aPTT: 28
aPTT: 45 — ABNORMAL HIGH

## 2011-04-19 LAB — CBC
HCT: 25.1 — ABNORMAL LOW
HCT: 27.2 — ABNORMAL LOW
HCT: 27.2 — ABNORMAL LOW
HCT: 27.5 — ABNORMAL LOW
HCT: 28.2 — ABNORMAL LOW
HCT: 30.3 — ABNORMAL LOW
HCT: 46.8
Hemoglobin: 8.7 — ABNORMAL LOW
Hemoglobin: 10.4 — ABNORMAL LOW
Hemoglobin: 16.1
Hemoglobin: 9.3 — ABNORMAL LOW
Hemoglobin: 9.4 — ABNORMAL LOW
Hemoglobin: 9.6 — ABNORMAL LOW
Hemoglobin: 9.6 — ABNORMAL LOW
MCHC: 34.5
MCHC: 34.1
MCHC: 34.2
MCHC: 34.4
MCHC: 34.4
MCHC: 34.5
MCHC: 35
MCV: 91
MCV: 89.6
MCV: 90.6
MCV: 90.9
MCV: 91.4
MCV: 91.5
MCV: 92.1
Platelets: 101 — ABNORMAL LOW
Platelets: 115 — ABNORMAL LOW
Platelets: 119 — ABNORMAL LOW
Platelets: 122 — ABNORMAL LOW
Platelets: 123 — ABNORMAL LOW
Platelets: 140 — ABNORMAL LOW
Platelets: 189
RBC: 2.76 — ABNORMAL LOW
RBC: 2.97 — ABNORMAL LOW
RBC: 3 — ABNORMAL LOW
RBC: 3.06 — ABNORMAL LOW
RBC: 3.07 — ABNORMAL LOW
RBC: 3.31 — ABNORMAL LOW
RBC: 5.15
RDW: 13.8
RDW: 13.6
RDW: 13.7
RDW: 13.7
RDW: 13.8
RDW: 13.9
RDW: 14.2
WBC: 6.5
WBC: 10.5
WBC: 6.7
WBC: 7.3
WBC: 7.5
WBC: 8.3
WBC: 9

## 2011-04-19 LAB — TYPE AND SCREEN
ABO/RH(D): A NEG
Antibody Screen: NEGATIVE

## 2011-04-19 LAB — POCT I-STAT 3, VENOUS BLOOD GAS (G3P V)
Acid-base deficit: 1
Bicarbonate: 24.5 — ABNORMAL HIGH
O2 Saturation: 69
TCO2: 24
pCO2, Ven: 42.6 — ABNORMAL LOW
pO2, Ven: 37
pO2, Ven: 37

## 2011-04-19 LAB — I-STAT 8, (EC8 V) (CONVERTED LAB)
Acid-base deficit: 4 — ABNORMAL HIGH
BUN: 17
Bicarbonate: 21.6
Chloride: 103
Glucose, Bld: 116 — ABNORMAL HIGH
HCT: 26 — ABNORMAL LOW
Hemoglobin: 8.8 — ABNORMAL LOW
Operator id: 235971
Potassium: 4.2
Sodium: 136
TCO2: 23
pCO2, Ven: 39.9 — ABNORMAL LOW
pH, Ven: 7.342 — ABNORMAL HIGH

## 2011-04-19 LAB — COMPREHENSIVE METABOLIC PANEL
ALT: 22
AST: 24
Albumin: 4.2
Alkaline Phosphatase: 52
BUN: 12
CO2: 22
Calcium: 9.6
Chloride: 106
Creatinine, Ser: 1.06
GFR calc Af Amer: >60
GFR calc non Af Amer: >60
Glucose, Bld: 100 — ABNORMAL HIGH
Potassium: 4.5
Sodium: 137
Total Bilirubin: 1.3 — ABNORMAL HIGH
Total Protein: 6.3

## 2011-04-19 LAB — PROTIME-INR
INR: 1.1
INR: 1.5
INR: 2.3 — ABNORMAL HIGH
INR: 3.2 — ABNORMAL HIGH
INR: 1
INR: 1.2
INR: 1.7 — ABNORMAL HIGH
Prothrombin Time: 14.8
Prothrombin Time: 18.3 — ABNORMAL HIGH
Prothrombin Time: 26.3 — ABNORMAL HIGH
Prothrombin Time: 34 — ABNORMAL HIGH
Prothrombin Time: 13.1
Prothrombin Time: 15.8 — ABNORMAL HIGH
Prothrombin Time: 20.5 — ABNORMAL HIGH

## 2011-04-19 LAB — CREATININE, SERUM
Creatinine, Ser: 0.93
Creatinine, Ser: 1.31
GFR calc Af Amer: >60
GFR calc Af Amer: >60
GFR calc non Af Amer: 55 — ABNORMAL LOW
GFR calc non Af Amer: >60

## 2011-04-19 LAB — ABO/RH: ABO/RH(D): A NEG

## 2011-04-19 LAB — PLATELET COUNT: Platelets: 138 — ABNORMAL LOW

## 2011-04-19 LAB — MAGNESIUM
Magnesium: 2.5
Magnesium: 2.6 — ABNORMAL HIGH
Magnesium: 2.9 — ABNORMAL HIGH

## 2011-04-19 LAB — POCT I-STAT GLUCOSE
Glucose, Bld: 106 — ABNORMAL HIGH
Operator id: 3406

## 2011-04-19 LAB — HEMOGLOBIN AND HEMATOCRIT, BLOOD
HCT: 29.6 — ABNORMAL LOW
Hemoglobin: 10.2 — ABNORMAL LOW

## 2011-04-19 LAB — HEMOGLOBIN A1C
Hgb A1c MFr Bld: 4.8
Mean Plasma Glucose: 94

## 2011-07-12 HISTORY — PX: OTHER SURGICAL HISTORY: SHX169

## 2011-08-15 ENCOUNTER — Other Ambulatory Visit: Payer: Self-pay | Admitting: Gastroenterology

## 2013-04-06 ENCOUNTER — Other Ambulatory Visit: Payer: Self-pay | Admitting: Cardiology

## 2013-04-06 DIAGNOSIS — I4891 Unspecified atrial fibrillation: Secondary | ICD-10-CM

## 2013-04-06 DIAGNOSIS — Z79899 Other long term (current) drug therapy: Secondary | ICD-10-CM

## 2013-05-12 ENCOUNTER — Encounter: Payer: Self-pay | Admitting: Cardiology

## 2013-05-14 ENCOUNTER — Other Ambulatory Visit: Payer: Self-pay

## 2013-05-14 ENCOUNTER — Ambulatory Visit: Payer: Self-pay | Admitting: Cardiology

## 2013-05-28 ENCOUNTER — Encounter: Payer: Self-pay | Admitting: *Deleted

## 2013-05-29 ENCOUNTER — Encounter (INDEPENDENT_AMBULATORY_CARE_PROVIDER_SITE_OTHER): Payer: Self-pay

## 2013-05-29 ENCOUNTER — Other Ambulatory Visit (INDEPENDENT_AMBULATORY_CARE_PROVIDER_SITE_OTHER): Payer: Federal, State, Local not specified - PPO

## 2013-05-29 ENCOUNTER — Encounter: Payer: Self-pay | Admitting: Cardiology

## 2013-05-29 ENCOUNTER — Ambulatory Visit (INDEPENDENT_AMBULATORY_CARE_PROVIDER_SITE_OTHER): Payer: Federal, State, Local not specified - PPO | Admitting: Cardiology

## 2013-05-29 VITALS — BP 134/90 | HR 67 | Ht 74.0 in | Wt 229.4 lb

## 2013-05-29 DIAGNOSIS — Z79899 Other long term (current) drug therapy: Secondary | ICD-10-CM

## 2013-05-29 DIAGNOSIS — E785 Hyperlipidemia, unspecified: Secondary | ICD-10-CM

## 2013-05-29 DIAGNOSIS — I4891 Unspecified atrial fibrillation: Secondary | ICD-10-CM | POA: Insufficient documentation

## 2013-05-29 DIAGNOSIS — E7800 Pure hypercholesterolemia, unspecified: Secondary | ICD-10-CM | POA: Insufficient documentation

## 2013-05-29 DIAGNOSIS — N529 Male erectile dysfunction, unspecified: Secondary | ICD-10-CM

## 2013-05-29 DIAGNOSIS — B029 Zoster without complications: Secondary | ICD-10-CM | POA: Insufficient documentation

## 2013-05-29 DIAGNOSIS — C4492 Squamous cell carcinoma of skin, unspecified: Secondary | ICD-10-CM | POA: Insufficient documentation

## 2013-05-29 DIAGNOSIS — I10 Essential (primary) hypertension: Secondary | ICD-10-CM

## 2013-05-29 HISTORY — DX: Zoster without complications: B02.9

## 2013-05-29 LAB — CBC
MCHC: 34.1 g/dL (ref 30.0–36.0)
MCV: 95.3 fl (ref 78.0–100.0)
RDW: 14.2 % (ref 11.5–14.6)

## 2013-05-29 LAB — CREATININE, SERUM: Creatinine, Ser: 1.2 mg/dL (ref 0.4–1.5)

## 2013-05-29 MED ORDER — DILTIAZEM HCL ER COATED BEADS 120 MG PO CP24: 120.0000 mg | ORAL_CAPSULE | Freq: Every day | ORAL | Status: DC

## 2013-05-29 NOTE — Patient Instructions (Addendum)
Your physician recommends that you schedule a follow-up appointment in: 1 month with Dr. Anne Fu  Your physician recommends that you have for lab work today: BMET, CBC  Your physician has recommended you make the following change in your medication:  1. Stop Metoprolol 2. Stop Norvasc 3. Start Diltiazem CD 120 mg 1 tab daily

## 2013-05-29 NOTE — Progress Notes (Signed)
[address] [place], [place]  [address] Phone: [phone] Fax:  [phone]  Date:  05/29/2013   ID:  Jerome Garrett, DOB 11/23/1940, MRN [id number]  PCP:  Jerome Bumpers, MD   History of Present Illness: Jerome Garrett is a 72 y.o. male with asymptomatic persistent atrial fibrillation. He has hyperlipidemia, hypertension.   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 He did have some postoperative atrial fibrillation. His prior cardiac cath showed no significant CAD.  He is asymptomatic relatively. He states that he is able to walk around the neighborhood without any difficulty. He went up and down the stairs the other day without problems. No syncope, no strokelike symptoms, no diabetes. Positive hypertension.  His echocardiogram demonstrated normal EF, normal prosthetic valve, mild left atrial enlargement with elevated left atrial filling pressures. I started him on HCTZ 25 mg because of this and he had a very potent diuresis stating that he lost about 20 pounds. His HCTZ was discontinued by Jerome Garrett after a basic metabolic profile showed a creatinine of 1.53. His blood pressure did go down but he was clearly hypovolemic.  He states at home he usually runs in the 130s systolic. At home OK.  Had a lengthy discussion about his risk of stroke with atrial fibrillation. CHADs-VAS is 2 hypertension and age greater than 55. He warrants anticoagulation. Xarelto started 10/24/12. Stop aspirin. He does not have valvular atrial fibrillation  Has been battling with shingles. Quite painful. His wife brings up memory impairment issues. Also brings up problems with easy bruising on anticoagulation. Fearful of lawsuits on television regarding anticoagulation. He also discloses to me some issues with erectile dysfunction. Perhaps metoprolol. Making a change    Wt Readings from Last 3 Encounters:  05/29/13 229 lb 6.4 oz (104.055 kg)     Past  Medical History  Diagnosis Date  . Atrial fibrillation   . Squamous cell carcinoma of skin, site unspecified   . Hypercholesteremia   . HTN (hypertension)   . Hyperlipidemia     Past Surgical History  Procedure Laterality Date  . Olecranon bursitis : 1990 surgery    . Cardiac catheterization      05/2007 Cardiac cath showed no significant CADz  . Knee surgery      Current Outpatient Prescriptions  Medication Sig Dispense Refill  . amLODipine (NORVASC) 5 MG tablet Take 5 mg by mouth daily.      . metoprolol tartrate (LOPRESSOR) 25 MG tablet Take 25 mg by mouth 2 (two) times daily.      . ramipril (ALTACE) 5 MG capsule Take 5 mg by mouth daily.      . Rivaroxaban (XARELTO) 20 MG TABS tablet Take 20 mg by mouth daily with supper.      . hydrochlorothiazide (HYDRODIURIL) 25 MG tablet Take 25 mg by mouth daily.       No current facility-administered medications for this visit.    Allergies:   Not on File  Social History:  The patient  reports that he has quit smoking. He does not have any smokeless tobacco history on file.   ROS:  Please see the history of present illness.   Denies any fevers, chills, bleeding, orthopnea, PND  PHYSICAL EXAM: VS:  BP 134/90  Pulse 67  Ht 6\' 2"  (1.88 m)  Wt 229 lb 6.4 oz (104.055 kg)  BMI 29.44 kg/m2 Well nourished, well developed, in no acute distress HEENT:  normal Neck: no JVD Cardiac:  Irregularly irregular, normal rate; no murmur Lungs:  clear to auscultation bilaterally, no wheezing, rhonchi or rales Abd: soft, nontender, no hepatomegaly Ext: no edema Skin: warm and dry, right thoracic dermatomal dry shingles Neuro: no focal abnormalities noted  EKG:   Atrial fibrillation, heart rate 67, nonspecific T-wave changes, possible left posterior fascicular block Echocardiogram: 09/15/11-normal ejection fraction, normal bioprosthetic aortic valve, elevated CVP  ASSESSMENT AND PLAN:  1. Permanent atrial fibrillation-continuing with  anticoagulation, rate control strategy, asymptomatic. Overall doing well. Because of erectile dysfunction, we will discontinue metoprolol and tried diltiazem CD 120 mg once a day. I will also stop his amlodipine so that he is not on 2 calcium channel blockers. 2. Chronic anticoagulation-Xarelto 20 mg. Check hemoglobin and creatinine every 6 months. 3. Bioprosthetic aortic valve replacement-currently doing well. No clinical changes. 4. Memory impairment-encouraged him to discuss with Jerome Garrett 5. Hypertension-under reasonable control. 6. Shingles-per primary 7. Erectile dysfunction-as above. Stopping Beta blocker. 8. We will see back in one month after making this medication change to ensure that his atrial fibrillation still remains under good control and that his blood pressure also is under reasonable control.  Signed, Donato Schultz, MD [facility]  05/29/2013 10:30 AM

## 2013-05-30 ENCOUNTER — Other Ambulatory Visit: Payer: Self-pay | Admitting: Cardiology

## 2013-06-10 NOTE — Progress Notes (Signed)
This was done already. See results.

## 2013-06-10 NOTE — Progress Notes (Signed)
Please have come in if not already done.

## 2013-06-10 NOTE — Progress Notes (Signed)
Creat. Done.

## 2013-06-26 ENCOUNTER — Encounter: Payer: Self-pay | Admitting: Cardiology

## 2013-06-26 ENCOUNTER — Ambulatory Visit (INDEPENDENT_AMBULATORY_CARE_PROVIDER_SITE_OTHER): Payer: Federal, State, Local not specified - PPO | Admitting: Cardiology

## 2013-06-26 VITALS — BP 119/74 | Ht 74.0 in | Wt 229.8 lb

## 2013-06-26 DIAGNOSIS — I10 Essential (primary) hypertension: Secondary | ICD-10-CM

## 2013-06-26 DIAGNOSIS — I4891 Unspecified atrial fibrillation: Secondary | ICD-10-CM

## 2013-06-26 DIAGNOSIS — N529 Male erectile dysfunction, unspecified: Secondary | ICD-10-CM

## 2013-06-26 NOTE — Progress Notes (Signed)
[address], [address] [place], [place]  [address] Phone: [phone] Fax:  [phone]  Date:  06/26/2013   ID:  Jerome Garrett, DOB Apr 18, 1941, MRN [id number]  PCP:  Charolett Bumpers, MD   History of Present Illness: Jerome Garrett is a 72 y.o. male with asymptomatic persistent atrial fibrillation. He has hyperlipidemia, hypertension.   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 He did have some postoperative atrial fibrillation. His prior cardiac cath showed no significant CAD.  He is asymptomatic relatively. He states that he is able to walk around the neighborhood without any difficulty. He went up and down the stairs the other day without problems. No syncope, no strokelike symptoms, no diabetes. Positive hypertension.  His echocardiogram demonstrated normal EF, normal prosthetic valve, mild left atrial enlargement with elevated left atrial filling pressures. I started him on HCTZ 25 mg because of this and he had a very potent diuresis stating that he lost about 20 pounds. His HCTZ was discontinued by Dr. Laural Benes after a basic metabolic profile showed a creatinine of 1.53. His blood pressure did go down but he was clearly hypovolemic.  He states at home he usually runs in the 130s systolic. At home OK.  Had a lengthy discussion about his risk of stroke with atrial fibrillation. CHADs-VAS is 2 hypertension and age greater than 55. He warrants anticoagulation. Xarelto started 10/24/12. Stop aspirin. He does not have valvular atrial fibrillation  Has been battling with shingles. Quite painful. His wife brings up memory impairment issues. Also brings up problems with easy bruising on anticoagulation. Fearful of lawsuits on television regarding anticoagulation.   Visit in November of 2014, he told me about erectile dysfunction issues. We decided to stop his beta blocker and try diltiazem. He is doing better with this medication.   Wt Readings  from Last 3 Encounters:  06/26/13 229 lb 12.8 oz (104.237 kg)  05/29/13 229 lb 6.4 oz (104.055 kg)     Past Medical History  Diagnosis Date  . Atrial fibrillation   . Squamous cell carcinoma of skin, site unspecified   . Hypercholesteremia   . HTN (hypertension)   . Hyperlipidemia   . Shingles 05/29/2013    11/14    Past Surgical History  Procedure Laterality Date  . Olecranon bursitis : 1990 surgery    . Cardiac catheterization      05/2007 Cardiac cath showed no significant CADz  . Knee surgery    . Aortic valve replacement N/A 2008    Dr. Maren Beach - no CAD    Current Outpatient Prescriptions  Medication Sig Dispense Refill  . ramipril (ALTACE) 5 MG capsule Take 5 mg by mouth daily.      Marland Kitchen diltiazem (CARDIZEM CD) 120 MG 24 hr capsule Take 1 capsule (120 mg total) by mouth daily.  30 capsule  4  . hydrochlorothiazide (HYDRODIURIL) 25 MG tablet Take 25 mg by mouth daily.      . Rivaroxaban (XARELTO) 20 MG TABS tablet Take 20 mg by mouth daily with supper.       No current facility-administered medications for this visit.    Allergies:   No Known Allergies  Social History:  The patient  reports that he has quit smoking. He does not have any smokeless tobacco history on file.   ROS:  Please see the history of present illness.   Denies any fevers, chills, bleeding, orthopnea, PND  PHYSICAL EXAM: VS:  BP 119/74  Ht 6\' 2"  (1.88 m)  Wt 229 lb 12.8 oz (104.237 kg)  BMI 29.49 kg/m2 Well nourished, well developed, in no acute distress HEENT: normal Neck: no JVD Cardiac:  Irregularly irregular, normal rate; no murmur Lungs:  clear to auscultation bilaterally, no wheezing, rhonchi or rales Abd: soft, nontender, no hepatomegaly Ext: no edema Skin: warm and dry, right thoracic dermatomal dry shingles Neuro: no focal abnormalities noted  EKG:   Atrial fibrillation, heart rate 67, nonspecific T-wave changes, possible left posterior fascicular block Echocardiogram:  09/15/11-normal ejection fraction, normal bioprosthetic aortic valve, elevated CVP  ASSESSMENT AND PLAN:  1. Permanent atrial fibrillation-continuing with anticoagulation, rate control strategy, asymptomatic. Overall doing well. Because of erectile dysfunction, I discontinued metoprolol and tried diltiazem CD 120 mg once a day (05/29/13). I will also stop his amlodipine so that he is not on 2 calcium channel blockers. 2. Chronic anticoagulation-Xarelto 20 mg. Check hemoglobin and creatinine every 6 months. 3. Bioprosthetic aortic valve replacement-currently doing well. No clinical changes. 4. Memory impairment-encouraged him to discuss with Dr. Laural Benes 5. Hypertension-under good control. 6. Shingles-per Dr. Laural Benes 7. Erectile dysfunction-as above. Stopped Beta blocker and started diltiazem in November of 2014. Improved. We will see back in 6 months Signed, Donato Schultz, MD [facility]  06/26/2013 10:35 AM

## 2013-06-26 NOTE — Patient Instructions (Signed)
Your physician recommends that you continue on your current medications as directed. Please refer to the Current Medication list given to you today.  Your physician wants you to follow-up in: 6 months with Dr. Skains. You will receive a reminder letter in the mail two months in advance. If you don't receive a letter, please call our office to schedule the follow-up appointment.  

## 2013-07-02 NOTE — Progress Notes (Signed)
Left message on vociemail to call the office

## 2013-08-06 ENCOUNTER — Other Ambulatory Visit: Payer: Self-pay

## 2013-08-06 MED ORDER — RAMIPRIL 5 MG PO CAPS: 5.0000 mg | ORAL_CAPSULE | Freq: Every day | ORAL | Status: DC

## 2013-08-22 ENCOUNTER — Other Ambulatory Visit: Payer: Self-pay | Admitting: Dermatology

## 2013-10-22 ENCOUNTER — Other Ambulatory Visit: Payer: Self-pay

## 2013-10-22 MED ORDER — RAMIPRIL 5 MG PO CAPS: 5.0000 mg | ORAL_CAPSULE | Freq: Every day | ORAL | Status: DC

## 2013-10-22 MED ORDER — DILTIAZEM HCL ER COATED BEADS 120 MG PO CP24: 120.0000 mg | ORAL_CAPSULE | Freq: Every day | ORAL | Status: DC

## 2013-11-13 ENCOUNTER — Other Ambulatory Visit: Payer: Self-pay | Admitting: *Deleted

## 2013-11-13 MED ORDER — RIVAROXABAN 20 MG PO TABS: 20.0000 mg | ORAL_TABLET | Freq: Every day | ORAL | Status: DC

## 2013-12-18 ENCOUNTER — Other Ambulatory Visit: Payer: Self-pay

## 2013-12-18 MED ORDER — HYDROCHLOROTHIAZIDE 25 MG PO TABS: 25.0000 mg | ORAL_TABLET | Freq: Every day | ORAL | Status: DC

## 2014-01-10 ENCOUNTER — Other Ambulatory Visit: Payer: Self-pay | Admitting: Cardiology

## 2014-01-28 ENCOUNTER — Encounter: Payer: Self-pay | Admitting: Cardiology

## 2014-01-28 ENCOUNTER — Ambulatory Visit (INDEPENDENT_AMBULATORY_CARE_PROVIDER_SITE_OTHER): Payer: Federal, State, Local not specified - PPO | Admitting: Cardiology

## 2014-01-28 VITALS — BP 133/90 | HR 83 | Wt 222.0 lb

## 2014-01-28 DIAGNOSIS — Z952 Presence of prosthetic heart valve: Secondary | ICD-10-CM | POA: Insufficient documentation

## 2014-01-28 DIAGNOSIS — I4819 Other persistent atrial fibrillation: Secondary | ICD-10-CM

## 2014-01-28 DIAGNOSIS — I10 Essential (primary) hypertension: Secondary | ICD-10-CM

## 2014-01-28 DIAGNOSIS — I4891 Unspecified atrial fibrillation: Secondary | ICD-10-CM

## 2014-01-28 DIAGNOSIS — Z79899 Other long term (current) drug therapy: Secondary | ICD-10-CM

## 2014-01-28 DIAGNOSIS — Z954 Presence of other heart-valve replacement: Secondary | ICD-10-CM

## 2014-01-28 LAB — CBC
HCT: 45.9 % (ref 39.0–52.0)
Hemoglobin: 15.5 g/dL (ref 13.0–17.0)
MCHC: 33.7 g/dL (ref 30.0–36.0)
MCV: 97.4 fl (ref 78.0–100.0)
PLATELETS: 185 10*3/uL (ref 150.0–400.0)
RBC: 4.72 Mil/uL (ref 4.22–5.81)
RDW: 14.3 % (ref 11.5–15.5)
WBC: 8 10*3/uL (ref 4.0–10.5)

## 2014-01-28 LAB — BASIC METABOLIC PANEL
BUN: 15 mg/dL (ref 6–23)
CALCIUM: 9.6 mg/dL (ref 8.4–10.5)
CO2: 29 meq/L (ref 19–32)
Chloride: 101 mEq/L (ref 96–112)
Creatinine, Ser: 1.2 mg/dL (ref 0.4–1.5)
GFR: 65.21 mL/min (ref >60.00)
GLUCOSE: 89 mg/dL (ref 70–99)
Potassium: 4 mEq/L (ref 3.5–5.1)
SODIUM: 138 meq/L (ref 135–145)

## 2014-01-28 NOTE — Patient Instructions (Signed)
The current medical regimen is effective;  continue present plan and medications. DO NOT TAKE ADVIL!!    Please have blood work today.  (BMP and CBC)  Follow up in 6 months with Dr Marlou Porch.  You will receive a letter in the mail 2 months before you are due.  Please call us when you receive this letter to schedule your follow up appointment.

## 2014-01-28 NOTE — Progress Notes (Signed)
[facility]. [address], [address], [place]  [address] Phone: [phone] Fax:  [phone]  Date:  01/28/2014   ID:  Jerome Garrett, DOB [birth year], MRN [id number]  PCP:  Garlan Fair, MD   History of Present Illness: Jerome Garrett is a 73 y.o. male with asymptomatic persistent atrial fibrillation. He has hyperlipidemia, hypertension.   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 He did have some postoperative atrial fibrillation. His prior cardiac cath showed no significant CAD.  He is asymptomatic relatively. He states that he is able to walk around the neighborhood without any difficulty. He went up and down the stairs the other day without problems. No syncope, no strokelike symptoms, no diabetes. Positive hypertension.  His echocardiogram demonstrated normal EF, normal prosthetic valve, mild left atrial enlargement with elevated left atrial filling pressures. I started him on HCTZ 25 mg because of this and he had a very potent diuresis stating that he lost about 20 pounds. His HCTZ was discontinued by Dr. Wynetta Emery after a basic metabolic profile showed a creatinine of 1.53. His blood pressure did go down but he was clearly hypovolemic.  He states at home he usually runs in the 035K systolic. At home OK.  Had a lengthy discussion about his risk of stroke with atrial fibrillation. CHADs-VAS is 2 hypertension and age greater than 37. He warrants anticoagulation. Xarelto started 10/24/12. Stop aspirin. He does not have valvular atrial fibrillation  Has been battling with shingles. Quite painful. His wife brings up memory impairment issues. Also brings up problems with easy bruising on anticoagulation. Fearful of lawsuits on television regarding anticoagulation.   Visit in November of 2014, he told me about erectile dysfunction issues. We decided to stop his beta blocker and try diltiazem. He is doing better with this medication.  01/28/2014-easy  bruising discussed. Beach vacation to [facility]). Had lengthy discussion about atrial fibrillation, stroke risks, anticoagulation. He is asymptomatic. No chest pain. Mild neuropathic pain following shingles. Right chest wall. Denies any melena. No syncope.   Wt Readings from Last 3 Encounters:  01/28/14 222 lb (100.699 kg)  06/26/13 229 lb 12.8 oz (104.237 kg)  05/29/13 229 lb 6.4 oz (104.055 kg)     Past Medical History  Diagnosis Date  . Atrial fibrillation   . Squamous cell carcinoma of skin, site unspecified   . Hypercholesteremia   . HTN (hypertension)   . Hyperlipidemia   . Shingles 05/29/2013    11/14  . Aortic stenosis     s/p aortic valve    Past Surgical History  Procedure Laterality Date  . Olecranon bursitis : 1990 surgery    . Cardiac catheterization      05/2007 Cardiac cath showed no significant CADz  . Knee surgery    . Aortic valve replacement N/A 2008    Dr. Darcey Nora - no CAD    Current Outpatient Prescriptions  Medication Sig Dispense Refill  . diltiazem (CARDIZEM CD) 120 MG 24 hr capsule Take 1 capsule (120 mg total) by mouth daily.  90 capsule  1  . hydrochlorothiazide (HYDRODIURIL) 25 MG tablet Take 1 tablet (25 mg total) by mouth daily.  30 tablet  6  . metoprolol tartrate (LOPRESSOR) 25 MG tablet Take 25 mg by mouth 2 (two) times daily.      . ramipril (ALTACE) 5 MG capsule Take 1 capsule (5 mg total) by mouth daily.  90 capsule  1  .  XARELTO 20 MG TABS tablet TAKE 1 TABLET (20 MG TOTAL) BY MOUTH DAILY WITH SUPPER.  30 tablet  0  . amLODipine (NORVASC) 5 MG tablet Take 5 mg by mouth daily.       No current facility-administered medications for this visit.    Allergies:    Allergies  Allergen Reactions  . Benicar [Olmesartan] Other (See Comments)    Headache  . Eggs Or Egg-Derived Products     Social History:  The patient  reports that he quit smoking about 20 years ago. He does not have any smokeless tobacco history on file. He  reports that he drinks about 2.4 ounces of alcohol per week. He reports that he does not use illicit drugs.   ROS:  Please see the history of present illness.   Denies any fevers, chills, bleeding, orthopnea, PND  PHYSICAL EXAM: VS:  BP 133/90  Pulse 83  Wt 222 lb (100.699 kg) Well nourished, well developed, in no acute distress HEENT: normal Neck: no JVD Cardiac:  Irregularly irregular, normal rate; no murmur Lungs:  clear to auscultation bilaterally, no wheezing, rhonchi or rales Abd: soft, nontender, no hepatomegaly Ext: no edema Skin: warm and dry, right thoracic dermatomal dry shingles Neuro: no focal abnormalities noted  EKG:   Atrial fibrillation, heart rate 67, nonspecific T-wave changes, possible left posterior fascicular block Echocardiogram: 09/15/11-normal ejection fraction, normal bioprosthetic aortic valve, elevated CVP  ASSESSMENT AND PLAN:  1. Permanent atrial fibrillation-continuing with anticoagulation, rate control strategy, asymptomatic. Overall doing well. Because of erectile dysfunction, I discontinued metoprolol and tried diltiazem CD 120 mg once a day (05/29/13). I will also stop his amlodipine so that he is not on 2 calcium channel blockers.  I discussed that cardioversion would not hold. 2. Chronic anticoagulation-Xarelto 20 mg. Check hemoglobin and creatinine every 6 months. Easy bruising. Do not take Advil. 3. Bioprosthetic aortic valve replacement-currently doing well. No clinical changes. 4. Memory impairment-encouraged him to discuss with Dr. Wynetta Emery 5. Hypertension essential-under good control. 73. Shingles-per Dr. Wynetta Emery, has associated neuropathy 7. Erectile dysfunction-as above. Stopped Beta blocker and started diltiazem in November of 2014. Improved. We will see back in 6 months Signed, Candee Furbish, MD [facility]  01/28/2014 3:21 PM

## 2014-01-28 NOTE — Addendum Note (Signed)
Addended by: Shellia Cleverly on: 01/28/2014 04:18 PM   Modules accepted: Orders, Medications

## 2014-02-11 ENCOUNTER — Other Ambulatory Visit: Payer: Self-pay | Admitting: Cardiology

## 2014-04-13 ENCOUNTER — Other Ambulatory Visit: Payer: Self-pay | Admitting: Cardiology

## 2014-06-13 ENCOUNTER — Other Ambulatory Visit: Payer: Self-pay | Admitting: Cardiology

## 2014-08-05 ENCOUNTER — Other Ambulatory Visit: Payer: Self-pay | Admitting: Dermatology

## 2014-08-07 ENCOUNTER — Other Ambulatory Visit: Payer: Self-pay | Admitting: Cardiology

## 2014-08-15 ENCOUNTER — Other Ambulatory Visit: Payer: Self-pay | Admitting: Cardiology

## 2014-08-22 ENCOUNTER — Other Ambulatory Visit: Payer: Self-pay | Admitting: Cardiology

## 2014-08-22 MED ORDER — HYDROCHLOROTHIAZIDE 25 MG PO TABS: 25.0000 mg | ORAL_TABLET | Freq: Every day | ORAL | Status: DC

## 2014-08-28 ENCOUNTER — Ambulatory Visit (INDEPENDENT_AMBULATORY_CARE_PROVIDER_SITE_OTHER): Payer: Federal, State, Local not specified - PPO | Admitting: Cardiology

## 2014-08-28 ENCOUNTER — Encounter: Payer: Self-pay | Admitting: Cardiology

## 2014-08-28 ENCOUNTER — Other Ambulatory Visit: Payer: Self-pay | Admitting: *Deleted

## 2014-08-28 VITALS — BP 122/80 | HR 96 | Ht 74.0 in | Wt 226.0 lb

## 2014-08-28 DIAGNOSIS — I10 Essential (primary) hypertension: Secondary | ICD-10-CM

## 2014-08-28 DIAGNOSIS — I482 Chronic atrial fibrillation: Secondary | ICD-10-CM

## 2014-08-28 DIAGNOSIS — I4821 Permanent atrial fibrillation: Secondary | ICD-10-CM

## 2014-08-28 DIAGNOSIS — R040 Epistaxis: Secondary | ICD-10-CM

## 2014-08-28 NOTE — Patient Instructions (Signed)
The current medical regimen is effective;  continue present plan and medications.  Follow up in 6 months with Dr. Skains.  You will receive a letter in the mail 2 months before you are due.  Please call us when you receive this letter to schedule your follow up appointment.  Thank you for choosing [facility]!!     

## 2014-08-28 NOTE — Progress Notes (Signed)
[facility]. [address], [address] [place], [place]  [address] Phone: [phone] Fax:  [phone]  Date:  08/28/2014   ID:  ANDREZ LIEURANCE, DOB June 07, 1941, MRN [id number]  PCP:  Garlan Fair, MD   History of Present Illness: Jerome Garrett is a 74 y.o. male with asymptomatic persistent atrial fibrillation. He has hyperlipidemia, hypertension.   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 He did have some postoperative atrial fibrillation. His prior cardiac cath showed no significant CAD.  His echocardiogram demonstrated normal EF, normal prosthetic valve, mild left atrial enlargement with elevated left atrial filling pressures. I started him on HCTZ 25 mg because of this and he had a very potent diuresis stating that he lost about 20 pounds. His HCTZ was discontinued by Dr. Wynetta Emery after a basic metabolic profile showed a creatinine of 1.53. This seems however to have been restarted. His blood pressure did go down but he was clearly hypovolemic.  He states at home he usually runs in the 681L systolic. At home OK.  Had a lengthy discussion about his risk of stroke with atrial fibrillation. CHADs-VAS is 2 hypertension and age greater than 66. He warrants anticoagulation. Xarelto started 10/24/12. Stop aspirin. He does not have valvular atrial fibrillation  Has been battling with postherpetic neuralgia. Quite painful. His wife brings up memory impairment issues. Also brings up problems with easy bruising on anticoagulation.   Visit in November of 2014, he told me about erectile dysfunction issues. We decided to stop his beta blocker and try diltiazem. He is doing better with this medication.  01/28/2014-easy bruising discussed. Beach vacation to [facility]). Had lengthy discussion about atrial fibrillation, stroke risks, anticoagulation. He is asymptomatic. No chest pain. Mild neuropathic pain following shingles. Right chest wall. Denies any  melena. No syncope.  08/28/14-had 2 nosebleeds. On Xarelto. During cold weather. Still feeling some tiredness. Having postherpetic neuralgia. Memory issues at times, hearing. No syncopal.   Wt Readings from Last 3 Encounters:  08/28/14 226 lb (102.513 kg)  01/28/14 222 lb (100.699 kg)  06/26/13 229 lb 12.8 oz (104.237 kg)     Past Medical History  Diagnosis Date  . Atrial fibrillation   . Squamous cell carcinoma of skin, site unspecified   . Hypercholesteremia   . HTN (hypertension)   . Hyperlipidemia   . Shingles 05/29/2013    11/14  . Aortic stenosis     s/p aortic valve    Past Surgical History  Procedure Laterality Date  . Olecranon bursitis : 1990 surgery    . Cardiac catheterization      05/2007 Cardiac cath showed no significant CADz  . Knee surgery    . Aortic valve replacement N/A 2008    Dr. Darcey Nora - no CAD    Current Outpatient Prescriptions  Medication Sig Dispense Refill  . diltiazem (CARDIZEM CD) 120 MG 24 hr capsule TAKE ONE CAPSULE BY MOUTH EVERY DAY 90 capsule 1  . fluocinonide ointment (LIDEX) 0.05 %     . hydrochlorothiazide (HYDRODIURIL) 25 MG tablet Take 1 tablet (25 mg total) by mouth daily. 30 tablet 6  . ramipril (ALTACE) 5 MG capsule TAKE ONE CAPSULE BY MOUTH EVERY DAY 90 capsule 0  . XARELTO 20 MG TABS tablet TAKE 1 TABLET BY MOUTH EVERY DAY WITH SUPPER 90 tablet 0   No current facility-administered medications for this visit.    Allergies:    Allergies  Allergen Reactions  .  Benicar [Olmesartan] Other (See Comments)    Headache  . Eggs Or Egg-Derived Products     Social History:  The patient  reports that he quit smoking about 20 years ago. He does not have any smokeless tobacco history on file. He reports that he drinks about 2.4 oz of alcohol per week. He reports that he does not use illicit drugs.   ROS:  Please see the history of present illness.   Denies any fevers, chills, bleeding, orthopnea, PND  PHYSICAL EXAM: VS:  BP  122/80 mmHg  Pulse 96  Ht 6\' 2"  (1.88 m)  Wt 226 lb (102.513 kg)  BMI 29.00 kg/m2 Well nourished, well developed, in no acute distress HEENT: normal Neck: no JVD Cardiac:  Irregularly irregular, normal rate; no murmur Lungs:  clear to auscultation bilaterally, no wheezing, rhonchi or rales Abd: soft, nontender, no hepatomegaly Ext: no edema Skin: warm and dry, right thoracic dermatomal dry shingles Neuro: no focal abnormalities noted  EKG:   08/28/14-atrial fibrillation heart rate 96 with nonspecific T-wave changes-previous Atrial fibrillation, heart rate 67, nonspecific T-wave changes, possible left posterior fascicular block  Echocardiogram: 09/15/11-normal ejection fraction, normal bioprosthetic aortic valve, elevated CVP  ASSESSMENT AND PLAN:  1. Permanent atrial fibrillation-continuing with anticoagulation, rate control strategy, asymptomatic. His heart was slightly faster today likely secondary to pain from his postherpetic neuralgia. Overall doing well. Because of erectile dysfunction, I discontinued metoprolol and tried diltiazem CD 120 mg once a day (05/29/13). I will also stopped his amlodipine so that he is not on 2 calcium channel blockers.  I discussed that cardioversion would not likely hold. 2. Chronic anticoagulation-Xarelto 20 mg. Check hemoglobin and creatinine every 6 months. Easy bruising. Do not take Advil. He has had 2 nosebleeds, left nostril, right nostril during the extreme cold. If this continues to happen despite [employer], we will seek attention of your nose and throat doctor 3. Bioprosthetic aortic valve replacement-currently doing well. No clinical changes. Antibiotic prophylaxis 4. Memory impairment-encouraged him to continue to discuss with Dr. Wynetta Emery if symptoms worsen. Hearing impairment may make this worse 5. Hypertension essential-under good control. 29. Shingles-per Dr. Wynetta Emery, has associated neuropathy, postherpetic neuralgia. Considering acupuncture  therapy. 7. Erectile dysfunction-as above. Stopped Beta blocker and started diltiazem in November of 2014. Improved. Six-month follow-up  Signed, Candee Furbish, MD [facility]  08/28/2014 11:23 AM

## 2014-08-29 ENCOUNTER — Other Ambulatory Visit: Payer: Self-pay | Admitting: *Deleted

## 2014-08-29 MED ORDER — HYDROCHLOROTHIAZIDE 25 MG PO TABS: 25.0000 mg | ORAL_TABLET | Freq: Every day | ORAL | Status: DC

## 2014-12-01 ENCOUNTER — Other Ambulatory Visit: Payer: Self-pay | Admitting: Cardiology

## 2014-12-14 ENCOUNTER — Other Ambulatory Visit: Payer: Self-pay | Admitting: Cardiology

## 2015-01-07 ENCOUNTER — Telehealth: Payer: Self-pay | Admitting: Cardiology

## 2015-01-07 NOTE — Telephone Encounter (Signed)
Spoke with pt's wife - pt has had a nose bleed today, 3 last week and 3 the week before.  He saw his PCP yesterday and he does not have any type of ulceration per report.  Advised pt to use saline nasal spray and if he continues to have nose bleeds to call back and to be seen by ENT.  Wife states understanding.  appt was scheduled for Aug. When he is due.

## 2015-01-07 NOTE — Telephone Encounter (Signed)
New problem   Pt is having nose bleeds per wife calling and wish to speak to nurse.

## 2015-01-08 NOTE — Telephone Encounter (Signed)
Agree. SKAINS, MARK, MD  

## 2015-02-03 ENCOUNTER — Other Ambulatory Visit: Payer: Self-pay | Admitting: Cardiology

## 2015-02-04 ENCOUNTER — Other Ambulatory Visit: Payer: Self-pay | Admitting: Cardiology

## 2015-02-25 ENCOUNTER — Ambulatory Visit (INDEPENDENT_AMBULATORY_CARE_PROVIDER_SITE_OTHER): Payer: Federal, State, Local not specified - PPO | Admitting: Cardiology

## 2015-02-25 ENCOUNTER — Encounter: Payer: Self-pay | Admitting: Cardiology

## 2015-02-25 VITALS — BP 142/78 | HR 85 | Ht 74.0 in | Wt 221.0 lb

## 2015-02-25 DIAGNOSIS — Z952 Presence of prosthetic heart valve: Secondary | ICD-10-CM

## 2015-02-25 DIAGNOSIS — Z954 Presence of other heart-valve replacement: Secondary | ICD-10-CM | POA: Diagnosis not present

## 2015-02-25 DIAGNOSIS — I4819 Other persistent atrial fibrillation: Secondary | ICD-10-CM

## 2015-02-25 DIAGNOSIS — B029 Zoster without complications: Secondary | ICD-10-CM | POA: Diagnosis not present

## 2015-02-25 DIAGNOSIS — E7800 Pure hypercholesterolemia, unspecified: Secondary | ICD-10-CM

## 2015-02-25 DIAGNOSIS — E785 Hyperlipidemia, unspecified: Secondary | ICD-10-CM | POA: Diagnosis not present

## 2015-02-25 DIAGNOSIS — I481 Persistent atrial fibrillation: Secondary | ICD-10-CM

## 2015-02-25 NOTE — Progress Notes (Signed)
[facility]. [address], [address] [place], [place]  [address] Phone: [phone] Fax:  [phone]  Date:  02/25/2015   ID:  Jerome Garrett, DOB 1940/12/31, MRN [id number]  PCP:  Jerome Fair, MD   History of Present Illness: EWARD Garrett is a 74 y.o. male with asymptomatic persistent atrial fibrillation. He has hyperlipidemia, hypertension.   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 He did have some postoperative atrial fibrillation. His prior cardiac cath showed no significant CAD.  His echocardiogram demonstrated normal EF, normal prosthetic valve, mild left atrial enlargement with elevated left atrial filling pressures. I started him on HCTZ 25 mg because of this and he had a very potent diuresis stating that he lost about 20 pounds. His HCTZ was discontinued by Dr. Wynetta Garrett after a basic metabolic profile showed a creatinine of 1.53. This seems however to have been restarted. His blood pressure did go down but he was clearly hypovolemic.  He states at home he usually runs in the 998P systolic. At home OK.  Had a lengthy discussion about his risk of stroke with atrial fibrillation. CHADs-VAS is 2 hypertension and age greater than 98. He warrants anticoagulation. Xarelto started 10/24/12. Stop aspirin. He does not have valvular atrial fibrillation  Has been battling with postherpetic neuralgia. Quite painful. His wife brings up memory impairment issues. Also brings up problems with easy bruising on anticoagulation.   Visit in November of 2014, he told me about erectile dysfunction issues. We decided to stop his beta blocker and try diltiazem. He is doing better with this medication.  01/28/2014-easy bruising discussed. Beach vacation to [facility]). Had lengthy discussion about atrial fibrillation, stroke risks, anticoagulation. He is asymptomatic. No chest pain. Mild neuropathic pain following shingles. Right chest wall. Denies any  melena. No syncope.  08/28/14-had 2 nosebleeds. On Xarelto. During cold weather. Still feeling some tiredness. Having postherpetic neuralgia. Memory issues at times, hearing. No syncopal.   02/25/15- still having issues with postherpetic neuralgia. His legs also have been feeling hot, warm, red bilaterally perhaps unrelated. Hearing loss has still been a major issue. He does not like wearing his hearing aids. His daughter is currently living with them. They're thinking about moving to [place].   Nosebleeds have subsided.   Wt Readings from Last 3 Encounters:  02/25/15 221 lb (100.245 kg)  08/28/14 226 lb (102.513 kg)  01/28/14 222 lb (100.699 kg)     Past Medical History  Diagnosis Date  . Atrial fibrillation   . Squamous cell carcinoma of skin, site unspecified   . Hypercholesteremia   . HTN (hypertension)   . Hyperlipidemia   . Shingles 05/29/2013    11/14  . Aortic stenosis     s/p aortic valve    Past Surgical History  Procedure Laterality Date  . Olecranon bursitis : 1990 surgery    . Cardiac catheterization      05/2007 Cardiac cath showed no significant CADz  . Knee surgery    . Aortic valve replacement N/A 2008    Jerome Garrett - no CAD    Current Outpatient Prescriptions  Medication Sig Dispense Refill  . diltiazem (CARDIZEM CD) 120 MG 24 hr capsule TAKE 1 CAPSULE EVERY DAY 90 capsule 3  . fluocinonide ointment (LIDEX) 3.82 % Apply 1 application topically as needed.     . hydrochlorothiazide (HYDRODIURIL) 25 MG tablet TAKE 1 TABLET BY MOUTH EVERY DAY 90 tablet 3  . ramipril (  ALTACE) 5 MG capsule TAKE ONE CAPSULE BY MOUTH EVERY DAY 90 capsule 3  . XARELTO 20 MG TABS tablet TAKE 1 TABLET BY MOUTH EVERY DAY WITH SUPPER 90 tablet 3   No current facility-administered medications for this visit.    Allergies:    Allergies  Allergen Reactions  . Benicar [Olmesartan] Other (See Comments)    Headache  . Eggs Or Egg-Derived Products     Social History:  The patient   reports that he quit smoking about 21 years ago. He does not have any smokeless tobacco history on file. He reports that he drinks about 2.4 oz of alcohol per week. He reports that he does not use illicit drugs.   ROS:  Please see the history of present illness.   Denies any fevers, chills, bleeding, orthopnea, PND  PHYSICAL EXAM: VS:  BP 142/78 mmHg  Pulse 85  Ht 6\' 2"  (1.88 m)  Wt 221 lb (100.245 kg)  BMI 28.36 kg/m2  SpO2 98% Well nourished, well developed, in no acute distress HEENT: normal Neck: no JVD Cardiac:  Irregularly irregular, normal rate; no murmur Lungs:  clear to auscultation bilaterally, no wheezing, rhonchi or rales Abd: soft, nontender, no hepatomegaly Ext: no edema Skin: warm and dry, right thoracic dermatomal dry shingles Neuro: no focal abnormalities noted  EKG:   08/28/14-atrial fibrillation heart rate 96 with nonspecific T-wave changes-previous Atrial fibrillation, heart rate 67, nonspecific T-wave changes, possible left posterior fascicular block  Echocardiogram: 09/15/11-normal ejection fraction, normal bioprosthetic aortic valve, elevated CVP  ASSESSMENT AND PLAN:  1. Permanent atrial fibrillation-continuing with anticoagulation, rate control strategy, asymptomatic. Diltiazem CD 120 mg once a day (05/29/13). 2. Chronic anticoagulation-Xarelto 20 mg. Check hemoglobin and creatinine every 6 months. Easy bruising. Do not take Advil. He has had 2 nosebleeds, left nostril, right nostril during the extreme cold. If this continues to happen despite [employer], we will seek attention of your nose and throat doctor. 3. Bioprosthetic aortic valve replacement-currently doing well. No clinical changes. Antibiotic prophylaxis 4. Memory impairment-encouraged him to continue to discuss with Dr. Wynetta Garrett if symptoms worsen. Hearing impairment may make this worse 5. Hypertension essential-under good control. 42. Shingles-per Dr. Wynetta Garrett, has associated neuropathy, postherpetic  neuralgia. Tried acupuncture therapy. 7. Erectile dysfunction-as above. Stopped Beta blocker and started diltiazem in November of 2014. Improved. Six-month follow-up  Signed, Candee Furbish, MD [facility]  02/25/2015 12:16 PM

## 2015-02-25 NOTE — Patient Instructions (Signed)
Medication Instructions:  Your physician recommends that you continue on your current medications as directed. Please refer to the Current Medication list given to you today.  Follow-Up: Follow up in 6 months with Dr. Skains.  You will receive a letter in the mail 2 months before you are due.  Please call us when you receive this letter to schedule your follow up appointment.  Thank you for choosing [facility]!!     

## 2015-07-23 ENCOUNTER — Other Ambulatory Visit: Payer: Self-pay | Admitting: Gastroenterology

## 2015-07-23 DIAGNOSIS — G454 Transient global amnesia: Secondary | ICD-10-CM

## 2015-07-30 ENCOUNTER — Ambulatory Visit
Admission: RE | Admit: 2015-07-30 | Discharge: 2015-07-30 | Disposition: A | Discharge status: Home / Self Care | Payer: Federal, State, Local not specified - PPO | Source: Ambulatory Visit | Attending: Gastroenterology | Admitting: Gastroenterology

## 2015-07-30 ENCOUNTER — Other Ambulatory Visit: Payer: Self-pay | Admitting: Gastroenterology

## 2015-07-30 DIAGNOSIS — G454 Transient global amnesia: Secondary | ICD-10-CM

## 2015-07-30 MED ORDER — GADOBENATE DIMEGLUMINE 529 MG/ML IV SOLN: 20.0000 mL | Freq: Once | INTRAVENOUS | Status: DC | PRN

## 2015-11-05 ENCOUNTER — Telehealth: Payer: Self-pay | Admitting: Cardiology

## 2015-11-05 NOTE — Telephone Encounter (Signed)
Routed to MD and nurse to review

## 2015-11-05 NOTE — Telephone Encounter (Signed)
1. What office are you calling from? [place]  2. What is your office phone and fax number? [phone]) [phone] (F) [phone]   3. What type of procedure is the patient having performed? Dr. Edger House   4. What date is procedure scheduled? Pending clearance   5. What is your question (ex. Antibiotics prior to procedure, holding medication-we need to know how long dentist wants pt to hold med)? Need to hold Xarleto for 3 days   6.

## 2015-11-06 NOTE — Telephone Encounter (Signed)
Follow up      Calling to let the nurse know that the pt is having a right epidural steroid T 9-10 TFESI injection

## 2015-11-06 NOTE — Telephone Encounter (Signed)
Left message for Sharyn Lull to determine the type of procedure the pt is to undergo.

## 2015-11-06 NOTE — Telephone Encounter (Signed)
Appointment for 5/24 with Dr Marlou Porch.

## 2015-11-06 NOTE — Telephone Encounter (Signed)
Due for 6 month appt. Please have him come in. What type of procedure is he having?  Candee Furbish, MD

## 2015-11-07 NOTE — Telephone Encounter (Signed)
Hip injection. Should be able to proceed with low risk. OK to hold Xarelto for 3 days prior.  Candee Furbish, MD

## 2015-11-09 NOTE — Telephone Encounter (Signed)
Left message for Sharyn Lull and faxed this note to # left as requested.

## 2015-11-30 ENCOUNTER — Encounter: Payer: Self-pay | Admitting: Cardiology

## 2015-12-02 ENCOUNTER — Ambulatory Visit (INDEPENDENT_AMBULATORY_CARE_PROVIDER_SITE_OTHER): Payer: Federal, State, Local not specified - PPO | Admitting: Cardiology

## 2015-12-02 ENCOUNTER — Encounter: Payer: Self-pay | Admitting: Cardiology

## 2015-12-02 ENCOUNTER — Encounter: Payer: Self-pay | Admitting: *Deleted

## 2015-12-02 VITALS — BP 158/100 | HR 73 | Ht 74.0 in | Wt 216.0 lb

## 2015-12-02 DIAGNOSIS — I10 Essential (primary) hypertension: Secondary | ICD-10-CM | POA: Diagnosis not present

## 2015-12-02 DIAGNOSIS — Z954 Presence of other heart-valve replacement: Secondary | ICD-10-CM | POA: Diagnosis not present

## 2015-12-02 DIAGNOSIS — I4819 Other persistent atrial fibrillation: Secondary | ICD-10-CM

## 2015-12-02 DIAGNOSIS — Z952 Presence of prosthetic heart valve: Secondary | ICD-10-CM

## 2015-12-02 DIAGNOSIS — E7800 Pure hypercholesterolemia, unspecified: Secondary | ICD-10-CM

## 2015-12-02 DIAGNOSIS — E785 Hyperlipidemia, unspecified: Secondary | ICD-10-CM | POA: Diagnosis not present

## 2015-12-02 DIAGNOSIS — I481 Persistent atrial fibrillation: Secondary | ICD-10-CM

## 2015-12-02 NOTE — Progress Notes (Signed)
[facility]. [address], [address], [place]  [address] Phone: [phone] Fax:  [phone]  Date:  12/02/2015   ID:  Jerome Garrett, DOB [birth year], MRN [id number]  PCP:  Jerome Fair, Jerome Garrett   History of Present Illness: Jerome Garrett is a 75 y.o. male with asymptomatic persistent atrial fibrillation. He has hyperlipidemia, hypertension.   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 He did have some postoperative atrial fibrillation. His prior cardiac cath showed no significant CAD.  His most recent echocardiogram demonstrated normal EF, normal prosthetic valve, mild left atrial enlargement with elevated left atrial filling pressures. I started him on HCTZ 25 mg because of this and he had a very potent diuresis stating that he lost about 20 pounds. His HCTZ was discontinued by Dr. Wynetta Emery after a basic metabolic profile showed a creatinine of 1.53. This seems however to have been restarted and he is now stable.Marland Kitchen His blood pressure did go down but he was clearly hypovolemic.  He states at home he usually runs in the [id number] systolic. At home OK.  Had a lengthy discussion about his risk of stroke with atrial fibrillation. CHADs-VAS is 2 hypertension and age greater than 27. He warrants anticoagulation. Xarelto started 10/24/12. Stop aspirin. He does not have valvular atrial fibrillation  Has been battling with postherpetic neuralgia. Quite painful. His wife brings up memory impairment issues. Also brings up problems with easy bruising on anticoagulation.   Visit in November of 2014, he told me about erectile dysfunction issues. We decided to stop his beta blocker and try diltiazem. He is doing better with this medication.  01/28/2014-easy bruising discussed. [place] vacation to [facility]). Had lengthy discussion about atrial fibrillation, stroke risks, anticoagulation. He is asymptomatic. No chest pain. Mild neuropathic pain following shingles.  Right chest wall. Denies any melena. No syncope.  08/28/14-had 2 nosebleeds. On Xarelto. During cold weather. Still feeling some tiredness. Having postherpetic neuralgia. Memory issues at times, hearing. No syncopal.   02/25/15- still having issues with postherpetic neuralgia. His legs also have been feeling hot, warm, red bilaterally perhaps unrelated. Hearing loss has still been a major issue. He does not like wearing his hearing aids. His daughter is currently living with them. They're thinking about moving to [place].   Nosebleeds have subsided. 11/2415- moving to [place] to be closer to family. has been having back pain, postherpetic neuralgia as well. He undergo injection at [facility]. They're hoping to change physicians to [facility] once in [place]. Church nosebleeds. Saw ENT. No area to cauterize. He knows conservative measures to stop nosebleeding.     Wt Readings from Last 3 Encounters:  12/02/15 216 lb (97.977 kg)  02/25/15 221 lb (100.245 kg)  08/28/14 226 lb (102.513 kg)     Past Medical History  Diagnosis Date  . Atrial fibrillation ([place])   . Squamous cell carcinoma of skin, site unspecified   . Hypercholesteremia   . HTN (hypertension)   . Hyperlipidemia   . Shingles 05/29/2013    11/14  . Aortic stenosis     s/p aortic valve    Past Surgical History  Procedure Laterality Date  . Olecranon bursitis : 1990 surgery    . Cardiac catheterization      05/2007 Cardiac cath showed no significant CADz  . Knee surgery    . Aortic valve replacement N/A 2008    Jerome Garrett - no CAD    Current  Outpatient Prescriptions  Medication Sig Dispense Refill  . diltiazem (CARDIZEM CD) 120 MG 24 hr capsule TAKE 1 CAPSULE EVERY DAY 90 capsule 3  . fluocinonide ointment (LIDEX) [id number] % Apply 1 application topically as needed.     . ramipril (ALTACE) 5 MG capsule TAKE ONE CAPSULE BY MOUTH EVERY DAY 90 capsule 3  . XARELTO 20 MG TABS tablet TAKE 1 TABLET BY MOUTH EVERY DAY  WITH SUPPER 90 tablet 3   No current facility-administered medications for this visit.    Allergies:    Allergies  Allergen Reactions  . Benicar [Olmesartan] Other (See Comments)    Headache  . Eggs Or Egg-Derived Products     Social History:  The patient  reports that he quit smoking about 21 years ago. He does not have any smokeless tobacco history on file. He reports that he drinks about 2.4 oz of alcohol per week. He reports that he does not use illicit drugs.   ROS:  Please see the history of present illness.   Denies any fevers, chills, bleeding, orthopnea, PND  PHYSICAL EXAM: VS:  BP 158/100 mmHg  Pulse 73  Ht 6\' 2"  (1.88 m)  Wt 216 lb (97.977 kg)  BMI 27.72 kg/m2  SpO2 96% Well nourished, well developed, in no acute distress HEENT: normal Neck: no JVD Cardiac:  Irregularly irregular, normal rate; no murmur Lungs:  clear to auscultation bilaterally, no wheezing, rhonchi or rales Abd: soft, nontender, no hepatomegaly Ext: no edema Skin: warm and dry, right thoracic dermatomal dry shingles Neuro: no focal abnormalities noted  EKG:   EKG was ordered today 12/02/15-heart rate 73 bpm atrial fibrillation nonspecific ST-T wave changes personally viewed-prior 08/28/14-atrial fibrillation heart rate 96 with nonspecific T-wave changes-previous Atrial fibrillation, heart rate 67, nonspecific T-wave changes, possible left posterior fascicular block  Echocardiogram: 09/15/11-normal ejection fraction, normal bioprosthetic aortic valve, elevated CVP  ASSESSMENT AND PLAN:  1. Pre-spine injection evaluation-he may come off of Xarelto for 3 days prior to spinal injection. Start the following day. He has non-valvular atrial fibrillation. Jerome Jacobs, Jerome Garrett PMNR. No prior strokelike symptoms. No chest pain. He understands very small risk of stroke with discontinuation of Xarelto temporarily. 2. Permanent atrial fibrillation-continuing with anticoagulation, rate control strategy, asymptomatic.  Diltiazem CD 120 mg once a day (05/29/13). 3. Chronic anticoagulation-Xarelto 20 mg. Check hemoglobin and creatinine every 6 months. Easy bruising. Do not take Advil. He has had 2 nosebleeds, left nostril, right nostril during the extreme cold. If this continues to happen despite [employer], we will seek attention of your nose and throat doctor. 4. Bioprosthetic aortic valve replacement-currently doing well. No clinical changes. Antibiotic prophylaxis 5. Memory impairment-encouraged him to continue to discuss with Dr. Wynetta Emery if symptoms worsen. Hearing impairment may make this worse 6. Hypertension essential-under good control. 80. Shingles-per Dr. Wynetta Emery, has associated neuropathy, postherpetic neuralgia. Tried acupuncture therapy. 8. Erectile dysfunction-as above. Stopped Beta blocker and started diltiazem in November of 2014. Improved. Six-month follow-up  Signed, Jerome Furbish, [facility]  12/02/2015 9:27 AM

## 2015-12-02 NOTE — Patient Instructions (Signed)

## 2016-01-18 ENCOUNTER — Telehealth: Payer: Self-pay | Admitting: Cardiology

## 2016-01-18 NOTE — Telephone Encounter (Signed)
Left message to c/b.

## 2016-01-18 NOTE — Telephone Encounter (Signed)
New Message   Pt wife requested to speak with nurse. Pt was seen @ hospital in [place] for syncope. Pt wife wished to schedule echo no order in system and requested to speak with nurse. Please call back.

## 2016-01-19 NOTE — Telephone Encounter (Signed)
Left message at pt's home number to return the call.  Will need to speak with pt as I am unable to locate a DPR to speak with any one else on his behalf.

## 2016-01-19 NOTE — Telephone Encounter (Signed)
Follow Up: ° ° ° °Returning your call from yesterday. °

## 2016-01-20 ENCOUNTER — Telehealth: Payer: Self-pay | Admitting: Cardiology

## 2016-01-20 NOTE — Telephone Encounter (Signed)
New message      The pt was in [place] over the weekend and taken to the hospital in [place] over the weekend and the wife feels the pt needs to seen today or as soon ASAP.  The pt check himself out of the hospital against medical advise

## 2016-01-20 NOTE — Telephone Encounter (Signed)
Pt's wife called, she states that there were in [place] D/C for the weekend. Pt went to see a ball game. Pt drank 2 beers, it was hot too. Pt went up a few steps, when he was coming down the steps; he fainted and wet on himself. Wife states that pt did the same prior having  a valve replacement. Pt was taken to the hospital. While pt was in the hospital ER They did an EKG, a cat scan because he may have had a seizure . Results were normal. There was an abnormal lab the pt does not know what it is. Pt was advised to stayed to be admitted in the hospital. Pt refused and left without doctors advised. Pt is feeling fine today. An appointment was made with Lou Cal PA for July 27 th at 3:00 Pm pt and wife are aware.

## 2016-01-22 ENCOUNTER — Other Ambulatory Visit: Payer: Self-pay | Admitting: Cardiology

## 2016-02-03 NOTE — Telephone Encounter (Signed)
Please see next telephone note - pt has appt 7/27

## 2016-02-04 ENCOUNTER — Ambulatory Visit (INDEPENDENT_AMBULATORY_CARE_PROVIDER_SITE_OTHER): Payer: Federal, State, Local not specified - PPO | Admitting: Physician Assistant

## 2016-02-04 ENCOUNTER — Encounter: Payer: Self-pay | Admitting: Physician Assistant

## 2016-02-04 VITALS — BP 178/100 | HR 80 | Ht 74.0 in | Wt 219.4 lb

## 2016-02-04 DIAGNOSIS — I4819 Other persistent atrial fibrillation: Secondary | ICD-10-CM

## 2016-02-04 DIAGNOSIS — I10 Essential (primary) hypertension: Secondary | ICD-10-CM

## 2016-02-04 DIAGNOSIS — E785 Hyperlipidemia, unspecified: Secondary | ICD-10-CM | POA: Diagnosis not present

## 2016-02-04 DIAGNOSIS — Z954 Presence of other heart-valve replacement: Secondary | ICD-10-CM

## 2016-02-04 DIAGNOSIS — R55 Syncope and collapse: Secondary | ICD-10-CM

## 2016-02-04 DIAGNOSIS — E7800 Pure hypercholesterolemia, unspecified: Secondary | ICD-10-CM | POA: Diagnosis not present

## 2016-02-04 DIAGNOSIS — I481 Persistent atrial fibrillation: Secondary | ICD-10-CM | POA: Diagnosis not present

## 2016-02-04 DIAGNOSIS — Z952 Presence of prosthetic heart valve: Secondary | ICD-10-CM

## 2016-02-04 MED ORDER — AMLODIPINE BESYLATE 10 MG PO TABS: 10.0000 mg | ORAL_TABLET | Freq: Every day | ORAL | 3 refills | Status: DC

## 2016-02-04 MED ORDER — AMLODIPINE BESYLATE 5 MG PO TABS: 10.0000 mg | ORAL_TABLET | Freq: Every day | ORAL | 3 refills | Status: DC

## 2016-02-04 NOTE — Patient Instructions (Addendum)
Medication Instructions:  Your physician has recommended you make the following change in your medication:  1.  RESUME Amlodipine at 10 mg taking 1/2 tablet daily  Labwork: None ordered  Testing/Procedures: Your physician has requested that you have an echocardiogram. Echocardiography is a painless test that uses sound waves to create images of your heart. It provides your doctor with information about the size and shape of your heart and how well your heart's chambers and valves are working. This procedure takes approximately one hour. There are no restrictions for this procedure.   Follow-Up: Your physician recommends that you schedule a follow-up appointment in: WILL BE BASED UPON TEST RESULTS  Any Other Special Instructions Will Be Listed Below (If Applicable).   Echocardiogram An echocardiogram, or echocardiography, uses sound waves (ultrasound) to produce an image of your heart. The echocardiogram is simple, painless, obtained within a short period of time, and offers valuable information to your health care provider. The images from an echocardiogram can provide information such as:  Evidence of coronary artery disease (CAD).  Heart size.  Heart muscle function.  Heart valve function.  Aneurysm detection.  Evidence of a past heart attack.  Fluid buildup around the heart.  Heart muscle thickening.  Assess heart valve function. LET [facility] CARE PROVIDER KNOW ABOUT:  Any allergies you have.  All medicines you are taking, including vitamins, herbs, eye drops, creams, and over-the-counter medicines.  Previous problems you or members of your family have had with the use of anesthetics.  Any blood disorders you have.  Previous surgeries you have had.  Medical conditions you have.  Possibility of pregnancy, if this applies. BEFORE THE PROCEDURE  No special preparation is needed. Eat and drink normally.  PROCEDURE   In order to produce an image of your heart,  gel will be applied to your chest and a wand-like tool (transducer) will be moved over your chest. The gel will help transmit the sound waves from the transducer. The sound waves will harmlessly bounce off your heart to allow the heart images to be captured in real-time motion. These images will then be recorded.  You may need an IV to receive a medicine that improves the quality of the pictures. AFTER THE PROCEDURE You may return to your normal schedule including diet, activities, and medicines, unless your health care provider tells you otherwise.   This information is not intended to replace advice given to you by your health care provider. Make sure you discuss any questions you have with your health care provider.   Document Released: 06/24/2000 Document Revised: 07/18/2014 Document Reviewed: 03/04/2013 Elsevier Interactive Patient Education [employer].   If you need a refill on your cardiac medications before your next appointment, please call your pharmacy.

## 2016-02-04 NOTE — Progress Notes (Signed)
Cardiology Office Note    Date:  02/04/2016   ID:  Jerome Garrett, DOB 24-Jun-1941, MRN [id number]  PCP:  Garlan Fair, MD  Cardiologist:  Dr. Marlou Porch  CC: syncope while out of state  History of Present Illness:  Jerome Garrett is a 75 y.o. male with a history of asymptomatic persistent atrial fibrillation on Xarelto, HLD, HTN, AS s/p biprosthetic AVR (2008), ED that improved off BBs, post herpetic neuralgia and dementia who presents to clinic for evaluation after a syncopal episode while out of state.   His prior cardiac cath showed no significant CAD at the time of AVR in 2008.   Most recent echocardiogram in 2013 demonstrated normal EF, normal prosthetic valve, mild left atrial enlargement with elevated left atrial filling pressures. He was started on HCTZ 25 mg because of this and he had a very potent diuresis stating that he lost about 20 pounds. His HCTZ was discontinued by Dr. Wynetta Emery after a basic metabolic profile showed a creatinine of 1.53. This was subsequently restarted and he is now stable.  He has had ongoing issues with pain from post herpetic neuralgia and nosebleeds on Xarelto. He can usually stop nosebleeds with conservative therapy. Has seen ENT, nothing to cauterize.   He saw Dr. Gillian Shields on 12/02/15 for pre op eval prior to spinal injection. Xarelto was discontinued 3 days prior to this. Otherwise, he was doing well.  Per review of phone notes he was in [place] recently and had a syncopal episode. He was seen in a local hospital and left AMA. Apparantly lab work was okay and CT head negative for bleed. Cardiologist was called who said he was okay to leave but EDP wanted him to stay  Today he presents to clinic for follow up. The story was that he went to a baseball game on a hot day in [place] and walked up a bunch of steps when he passed out. His daughter was behind him and caught .He remembers walking up the stairs and then remembers waking up in his daughters arms  and was a little confused. He did loose control of his bladder. He had a similar experience on the golf [place] when he needed his valve replaced (back in 2008). He had had a few beers and hadn't eaten. He has had anymore episodes since that time. He has been feeling fine and doing his normal activities like mowing the lawn. No LE edema, orthopnea or PND. No chest pain. Spinal injection has been helping his nerve pain a little.   Past Medical History:  Diagnosis Date  . Aortic stenosis    s/p aortic valve  . Atrial fibrillation ([place])   . HTN (hypertension)   . Hypercholesteremia   . Hyperlipidemia   . Shingles 05/29/2013   11/14  . Squamous cell carcinoma of skin, site unspecified     Past Surgical History:  Procedure Laterality Date  . AORTIC VALVE REPLACEMENT N/A 2008   Dr. Darcey Nora - no CAD  . CARDIAC CATHETERIZATION     05/2007 Cardiac cath showed no significant CADz  . KNEE SURGERY    . Olecranon bursitis : 1990 surgery      Current Medications: Outpatient Medications Prior to Visit  Medication Sig Dispense Refill  . fluocinonide ointment (LIDEX) [id number] % Apply 1 application topically as needed (DERMATITIS).     . ramipril (ALTACE) 5 MG capsule TAKE ONE CAPSULE BY MOUTH EVERY DAY 90 capsule 3  . XARELTO 20 MG TABS tablet  TAKE 1 TABLET BY MOUTH EVERY DAY WITH SUPPER 90 tablet 1  . diltiazem (CARDIZEM CD) 120 MG 24 hr capsule TAKE 1 CAPSULE EVERY DAY (Patient not taking: Reported on 02/04/2016) 90 capsule 3   No facility-administered medications prior to visit.      Allergies:   Benicar [olmesartan] and Eggs or egg-derived products   Social History   Social History  . Marital status: Married    Spouse name: N/A  . Number of children: N/A  . Years of education: N/A   Social History Main Topics  . Smoking status: Former Smoker    Quit date: 01/02/1994  . Smokeless tobacco: Former Systems developer  . Alcohol use 2.4 oz/week    4 Cans of beer per week  . Drug use: No  . Sexual  activity: Not Asked   Other Topics Concern  . None   Social History Narrative  . None     Family History:  The patient's family history includes Heart attack in his brother and mother; Heart disease in his brother.     ROS:   Please see the history of present illness.    ROS All other systems reviewed and are negative.   PHYSICAL EXAM:   VS:  BP (!) 178/100   Pulse 80   Ht 6\' 2"  (1.88 m)   Wt 219 lb 6.4 oz (99.5 kg)   BMI 28.17 kg/m    GEN: Well nourished, well developed, in no acute distress  HEENT: normal  Neck: no JVD, carotid bruits, or masses Cardiac: irreg irreg, soft SEM murmur, rubs, or gallops,no edema  Respiratory:  clear to auscultation bilaterally, normal work of breathing GI: soft, nontender, nondistended, + BS MS: no deformity or atrophy  Skin: warm and dry, no rash Neuro:  Alert and Oriented x 3, Strength and sensation are intact Psych: euthymic mood, full affect  Wt Readings from Last 3 Encounters:  02/04/16 219 lb 6.4 oz (99.5 kg)  12/02/15 216 lb (98 kg)  02/25/15 221 lb (100.2 kg)      Studies/Labs Reviewed:   EKG:  EKG is ordered today.  The ekg ordered today demonstrates atrial fibrillation HR 79  Recent Labs: No results found for requested labs within last 8760 hours.   Lipid Panel No results found for: CHOL, TRIG, HDL, CHOLHDL, VLDL, LDLCALC, LDLDIRECT  Additional studies/ records that were reviewed today include:  Cath 2008  FINAL IMPRESSION:  1. Normal right heart pressures.  2. Normal cardiac output was some disparity between Fick and      thermodilution outputs as noted.  3. Critical aortic stenosis, both by Fick cardiac output and by      thermodilution cardiac output as described above.  4. Intact left ventricular size and global systolic function, ejection      fraction 75%.  5. Minimal but nonobstructive atherosclerotic coronary vascular      disease.  PLAN/RECOMMENDATIONS:  The patient will be referred for prompt   consideration of aortic valve replacement.  Echocardiogram: 09/15/11-normal ejection fraction, normal bioprosthetic aortic valve, elevated CVP  ASSESSMENT & PLAN:   Syncope: sounds like it was multifactorial due to dehdyradration, alcohol consumption, and lack of eating on a hot day.  Will update 2D ECHO to make sure AVR is stable although I do not think this is the cause of his syncope. Wife requests this for reassurance as he had syncope prior to his AVR. I do not see one in system since 2013 so I think this is  reasonable.   Permanent atrial fibrillation: rate well controlled on Metoprolol 25mg  BIDy. Continue Xarelto for CHADSVASC score of at least 3 (age, HTN, CHF). Has had issues with nosebleeds  AS s/p tissue AVR (2008): continue abx prophylaxis with dental procedures. Repeat 2D ECHO as above.   HTN: BP elevated today. He is on Lopressor 25mg  BID and Ramipril 5mg  daily. He was previously on amlodipine 5mg  daily which was discontinued. Will resume this and watch BP.   Hx of shingles with post herpatic neuralgia: stable and chronic   Medication Adjustments/Labs and Tests Ordered: Current medicines are reviewed at length with the patient today.  Concerns regarding medicines are outlined above.  Medication changes, Labs and Tests ordered today are listed in the Patient Instructions below. Patient Instructions  Medication Instructions:  Your physician has recommended you make the following change in your medication:  1.  RESUME Amlodipine at 10 mg taking 1/2 tablet daily  Labwork: None ordered  Testing/Procedures: Your physician has requested that you have an echocardiogram. Echocardiography is a painless test that uses sound waves to create images of your heart. It provides your doctor with information about the size and shape of your heart and how well your heart's chambers and valves are working. This procedure takes approximately one hour. There are no restrictions for this  procedure.   Follow-Up: Your physician recommends that you schedule a follow-up appointment in: WILL BE BASED UPON TEST RESULTS  Any Other Special Instructions Will Be Listed Below (If Applicable).   Echocardiogram An echocardiogram, or echocardiography, uses sound waves (ultrasound) to produce an image of your heart. The echocardiogram is simple, painless, obtained within a short period of time, and offers valuable information to your health care provider. The images from an echocardiogram can provide information such as:  Evidence of coronary artery disease (CAD).  Heart size.  Heart muscle function.  Heart valve function.  Aneurysm detection.  Evidence of a past heart attack.  Fluid buildup around the heart.  Heart muscle thickening.  Assess heart valve function. LET [facility] CARE PROVIDER KNOW ABOUT:  Any allergies you have.  All medicines you are taking, including vitamins, herbs, eye drops, creams, and over-the-counter medicines.  Previous problems you or members of your family have had with the use of anesthetics.  Any blood disorders you have.  Previous surgeries you have had.  Medical conditions you have.  Possibility of pregnancy, if this applies. BEFORE THE PROCEDURE  No special preparation is needed. Eat and drink normally.  PROCEDURE   In order to produce an image of your heart, gel will be applied to your chest and a wand-like tool (transducer) will be moved over your chest. The gel will help transmit the sound waves from the transducer. The sound waves will harmlessly bounce off your heart to allow the heart images to be captured in real-time motion. These images will then be recorded.  You may need an IV to receive a medicine that improves the quality of the pictures. AFTER THE PROCEDURE You may return to your normal schedule including diet, activities, and medicines, unless your health care provider tells you otherwise.   This information is  not intended to replace advice given to you by your health care provider. Make sure you discuss any questions you have with your health care provider.   Document Released: 06/24/2000 Document Revised: 07/18/2014 Document Reviewed: 03/04/2013 Elsevier Interactive Patient Education [employer].   If you need a refill on your cardiac medications before  your next appointment, please call your pharmacy.      Signed, Angelena Form, PA-C  02/04/2016 4:21 PM    [facility] [address], [place], [place]  [address] Phone: [phone]; Fax: [phone]

## 2016-02-07 ENCOUNTER — Other Ambulatory Visit: Payer: Self-pay | Admitting: Cardiology

## 2016-02-08 ENCOUNTER — Telehealth: Payer: Self-pay | Admitting: Internal Medicine

## 2016-02-08 NOTE — Telephone Encounter (Signed)
Pharmacy requesting a refill on Diltiazem 120 mg. It is not on pt's med list. Please advise

## 2016-02-08 NOTE — Telephone Encounter (Signed)
Not on pt's medication list. Please advisd

## 2016-02-08 NOTE — Telephone Encounter (Signed)
Left message for pt to c/b to discuss whether he is taking Diltiazem or not. At last visit he had not been taking it and he was started on Amlodipine 5 mg a day according to documentation (though medication lists says 5 mg 2 tablets daily).  Medication lists also include Metoprolol 25 mg BID, Ramipril 5 mg daily

## 2016-02-10 NOTE — Telephone Encounter (Signed)
Follow-up     *STAT* If patient is at the pharmacy, call can be transferred to refill team.   1. Which medications need to be refilled? (please list name of each medication and dose if known) Diltiazem ER 120 mg po daily  2. Which pharmacy/location (including street and city if local pharmacy) is medication to be sent to?[facility] on [address]  3. Do they need a 30 day or 90 day supply? 30 day

## 2016-02-10 NOTE — Telephone Encounter (Signed)
This Order Has Been Discontinued   Order Status By On Reason  Discontinued [name], [name] 02/04/16 1524 Change in therapy      Medication Detail    Disp Refills Start End   diltiazem (CARDIZEM CD) 120 MG 24 hr capsule (Discontinued) 90 capsule 3 02/03/2015 02/04/2016   Sig: TAKE 1 CAPSULE EVERY DAY   Patient not taking: Reported on 02/04/2016       Notes to Pharmacy: NEEDS REFILLS   Reason for Discontinue: Change in therapy   E-Prescribing Status: Receipt confirmed by pharmacy (02/03/2015 4:34 PM EDT)

## 2016-02-10 NOTE — Telephone Encounter (Signed)
Continue with what he is currently taking since his blood pressure is improved from previous visit. Candee Furbish, MD

## 2016-02-10 NOTE — Telephone Encounter (Signed)
Spoke with wife and pt about medications He is taking  Ramipril 5 mg QD Amlodipine 10 mg 1/2 tablet (5 mg)QD Diltiazem 120 mg QD  Xarelto.  When he was in the office recently he reported he was taking Metoprolol however he denies taking this medication now.  BP (the other day at [facility]) was 148/90.  Will review with Dr Marlou Porch and verify which medications he should be on.  They are aware I will c/b once this has been done.

## 2016-02-10 NOTE — Telephone Encounter (Signed)
Spoke with wife who is aware of medications pt is supposed to continue.  She reports he does not need a refill on his Diltiazem at this time.  Medication list will be corrected.

## 2016-02-14 ENCOUNTER — Other Ambulatory Visit: Payer: Self-pay | Admitting: Cardiology

## 2016-02-16 ENCOUNTER — Other Ambulatory Visit (HOSPITAL_COMMUNITY): Payer: Federal, State, Local not specified - PPO

## 2016-02-22 ENCOUNTER — Other Ambulatory Visit: Payer: Self-pay

## 2016-02-22 ENCOUNTER — Ambulatory Visit (HOSPITAL_COMMUNITY): Payer: Federal, State, Local not specified - PPO | Attending: Cardiovascular Disease

## 2016-02-22 ENCOUNTER — Telehealth: Payer: Self-pay | Admitting: *Deleted

## 2016-02-22 DIAGNOSIS — Z953 Presence of xenogenic heart valve: Secondary | ICD-10-CM | POA: Diagnosis not present

## 2016-02-22 DIAGNOSIS — E785 Hyperlipidemia, unspecified: Secondary | ICD-10-CM | POA: Diagnosis not present

## 2016-02-22 DIAGNOSIS — Z87891 Personal history of nicotine dependence: Secondary | ICD-10-CM | POA: Diagnosis not present

## 2016-02-22 DIAGNOSIS — R55 Syncope and collapse: Secondary | ICD-10-CM | POA: Insufficient documentation

## 2016-02-22 DIAGNOSIS — I119 Hypertensive heart disease without heart failure: Secondary | ICD-10-CM | POA: Insufficient documentation

## 2016-02-22 NOTE — Telephone Encounter (Signed)
Pt medical records from [facility] on [facility] desk.

## 2016-07-21 ENCOUNTER — Other Ambulatory Visit: Payer: Self-pay | Admitting: Cardiology

## 2016-07-22 ENCOUNTER — Telehealth: Payer: Self-pay | Admitting: *Deleted

## 2016-07-22 ENCOUNTER — Other Ambulatory Visit: Payer: Self-pay | Admitting: Cardiology

## 2016-07-22 DIAGNOSIS — I4819 Other persistent atrial fibrillation: Secondary | ICD-10-CM

## 2016-07-22 NOTE — Telephone Encounter (Signed)
Called pt because he is requesting a refill on Xarelto.  The pt has not had labs drawn since 01/28/14.  Spoke with pt's wife because pt is hard of hearing.  The pt's wife stated that the pt had enough medication but will need a refill in the near future.  Advised her that we needed to have his labs drawn next week and then we would refill the medication & she stated that was fine & would come over on Tuesday to do so. Please refer to appts.

## 2016-07-26 ENCOUNTER — Other Ambulatory Visit: Payer: Federal, State, Local not specified - PPO | Admitting: *Deleted

## 2016-07-26 DIAGNOSIS — I4819 Other persistent atrial fibrillation: Secondary | ICD-10-CM

## 2016-07-26 NOTE — Telephone Encounter (Signed)
°*  STAT* If patient is at the pharmacy, call can be transferred to refill team.   1. Which medications need to be refilled? (please list name of each medication and dose if known) Xarelto 20 mg 2. Which pharmacy/location (including street and city if local pharmacy) is medication to be sent to?[phone]  3. Do they need a 30 day or 90 day supply? 90 and refills

## 2016-07-27 LAB — CBC
HEMATOCRIT: 44.9 % (ref 37.5–51.0)
HEMOGLOBIN: 15.2 g/dL (ref 13.0–17.7)
MCH: 31.5 pg (ref 26.6–33.0)
MCHC: 33.9 g/dL (ref 31.5–35.7)
MCV: 93 fL (ref 79–97)
Platelets: 211 10*3/uL (ref 150–379)
RBC: 4.83 x10E6/uL (ref 4.14–5.80)
RDW: 14 % (ref 12.3–15.4)
WBC: 7.1 10*3/uL (ref 3.4–10.8)

## 2016-07-27 LAB — BASIC METABOLIC PANEL
BUN/Creatinine Ratio: 11 (ref 10–24)
BUN: 12 mg/dL (ref 8–27)
CO2: 24 mmol/L (ref 18–29)
CREATININE: 1.11 mg/dL (ref 0.76–1.27)
Calcium: 9.5 mg/dL (ref 8.6–10.2)
Chloride: 100 mmol/L (ref 96–106)
GFR calc Af Amer: 75 mL/min/{1.73_m2} (ref >59)
GFR, EST NON AFRICAN AMERICAN: 65 mL/min/{1.73_m2} (ref >59)
GLUCOSE: 110 mg/dL — AB (ref 65–99)
Potassium: 4.6 mmol/L (ref 3.5–5.2)
SODIUM: 141 mmol/L (ref 134–144)

## 2016-09-09 ENCOUNTER — Other Ambulatory Visit: Payer: Self-pay | Admitting: Gastroenterology

## 2016-09-22 ENCOUNTER — Encounter (HOSPITAL_COMMUNITY): Payer: Self-pay | Admitting: *Deleted

## 2016-09-26 ENCOUNTER — Encounter (HOSPITAL_COMMUNITY)
Admission: RE | Disposition: A | Discharge status: Home / Self Care | Payer: Self-pay | Source: Ambulatory Visit | Attending: Gastroenterology

## 2016-09-26 ENCOUNTER — Encounter (HOSPITAL_COMMUNITY): Payer: Self-pay | Admitting: *Deleted

## 2016-09-26 ENCOUNTER — Ambulatory Visit (HOSPITAL_COMMUNITY): Payer: Federal, State, Local not specified - PPO | Admitting: Anesthesiology

## 2016-09-26 ENCOUNTER — Ambulatory Visit (HOSPITAL_COMMUNITY)
Admission: RE | Admit: 2016-09-26 | Discharge: 2016-09-26 | Disposition: A | Discharge status: Home / Self Care | Payer: Federal, State, Local not specified - PPO | Source: Ambulatory Visit | Attending: Gastroenterology | Admitting: Gastroenterology

## 2016-09-26 DIAGNOSIS — K5730 Diverticulosis of large intestine without perforation or abscess without bleeding: Secondary | ICD-10-CM | POA: Insufficient documentation

## 2016-09-26 DIAGNOSIS — Z1211 Encounter for screening for malignant neoplasm of colon: Secondary | ICD-10-CM | POA: Insufficient documentation

## 2016-09-26 DIAGNOSIS — Z952 Presence of prosthetic heart valve: Secondary | ICD-10-CM | POA: Insufficient documentation

## 2016-09-26 DIAGNOSIS — Z87891 Personal history of nicotine dependence: Secondary | ICD-10-CM | POA: Insufficient documentation

## 2016-09-26 DIAGNOSIS — Z7901 Long term (current) use of anticoagulants: Secondary | ICD-10-CM | POA: Insufficient documentation

## 2016-09-26 DIAGNOSIS — F0390 Unspecified dementia without behavioral disturbance: Secondary | ICD-10-CM | POA: Diagnosis not present

## 2016-09-26 DIAGNOSIS — Z86010 Personal history of colonic polyps: Secondary | ICD-10-CM | POA: Insufficient documentation

## 2016-09-26 DIAGNOSIS — I10 Essential (primary) hypertension: Secondary | ICD-10-CM | POA: Insufficient documentation

## 2016-09-26 DIAGNOSIS — Z79899 Other long term (current) drug therapy: Secondary | ICD-10-CM | POA: Insufficient documentation

## 2016-09-26 DIAGNOSIS — I482 Chronic atrial fibrillation: Secondary | ICD-10-CM | POA: Diagnosis not present

## 2016-09-26 HISTORY — DX: Other complications of anesthesia, initial encounter: T88.59XA

## 2016-09-26 HISTORY — DX: Mild cognitive impairment of uncertain or unknown etiology: G31.84

## 2016-09-26 HISTORY — PX: COLONOSCOPY WITH PROPOFOL: SHX5780

## 2016-09-26 HISTORY — DX: Adverse effect of unspecified anesthetic, initial encounter: T41.45XA

## 2016-09-26 SURGERY — COLONOSCOPY WITH PROPOFOL
Anesthesia: Monitor Anesthesia Care

## 2016-09-26 MED ORDER — PROPOFOL 10 MG/ML IV BOLUS
INTRAVENOUS | Status: AC
  Filled 2016-09-26: qty 40

## 2016-09-26 MED ORDER — PHENYLEPHRINE HCL 10 MG/ML IJ SOLN
INTRAMUSCULAR | Status: DC | PRN
  Administered 2016-09-26 (×3): 40 ug via INTRAVENOUS

## 2016-09-26 MED ORDER — PROPOFOL 500 MG/50ML IV EMUL
INTRAVENOUS | Status: DC | PRN
  Administered 2016-09-26: 125 ug/kg/min via INTRAVENOUS

## 2016-09-26 MED ORDER — PROPOFOL 10 MG/ML IV BOLUS
INTRAVENOUS | Status: AC
  Filled 2016-09-26: qty 20

## 2016-09-26 MED ORDER — SODIUM CHLORIDE 0.9 % IV SOLN
INTRAVENOUS | Status: DC
Start: 2016-09-26 — End: 2016-09-26

## 2016-09-26 MED ORDER — PROPOFOL 500 MG/50ML IV EMUL
INTRAVENOUS | Status: DC | PRN
  Administered 2016-09-26: 50 mg via INTRAVENOUS

## 2016-09-26 MED ORDER — LACTATED RINGERS IV SOLN
INTRAVENOUS | Status: DC
  Administered 2016-09-26: 1000 mL via INTRAVENOUS
  Administered 2016-09-26: 10:00:00 via INTRAVENOUS

## 2016-09-26 SURGICAL SUPPLY — 22 items

## 2016-09-26 NOTE — Discharge Instructions (Signed)

## 2016-09-26 NOTE — H&P (Signed)
Procedure: Surveillance colonoscopy. 08/11/2011 surveillance colonoscopy was performed with removal of six small tubular adenomatous and sessile serrated adenomatous polyps.  History: The patient is a 76 year old male born [birth year]. He is scheduled to undergo a surveillance colonoscopy today. He chronically takes xarelto to treat atrial fibrillation.  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 Hypercholesterolemia. Hypertension. Permanent atrial fibrillation. Dementia. Squamous cell skin cancers. Arthroscopic knee surgery. 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 Colonic diverticulosis. Shingles complicated by post herpetic neuralgia diagnosed in 2014.  Allergies: Egg  Exam: Patient is alert and lying comfortably on the endoscopy stretcher. Abdomen is soft and nontender to palpation. Cardiac exam reveals an irregular rhythm consistent with permanent atrial fibrillation. Lungs are clear to auscultation.  Plan: Proceed with surveillance colonoscopy

## 2016-09-26 NOTE — Op Note (Signed)
[facility] Patient Name: Jerome Garrett Procedure Date: 09/26/2016 MRN: [id number] Attending MD: Garlan Fair , MD Date of Birth: 07/24/1940 CSN: [id number] Age: 76 Admit Type: Outpatient Procedure:                Colonoscopy Indications:              High risk colon cancer surveillance: Personal                            history of sessile serrated colon polyp (less than                            10 mm in size) with no dysplasia Providers:                Garlan Fair, MD, Hilma Favors, RN, William Dalton, Technician Referring MD:              Medicines:                Propofol per Anesthesia Complications:            No immediate complications. Estimated Blood Loss:     Estimated blood loss: none. Procedure:                Pre-Anesthesia Assessment:                           - Prior to the procedure, a History and Physical                            was performed, and patient medications and                            allergies were reviewed. The patient's tolerance of                            previous anesthesia was also reviewed. The risks                            and benefits of the procedure and the sedation                            options and risks were discussed with the patient.                            All questions were answered, and informed consent                            was obtained. Prior Anticoagulants: The patient has                            taken Xarelto (rivaroxaban), last dose was 4 days  prior to procedure. ASA Grade Assessment: III - A                            patient with severe systemic disease. After                            reviewing the risks and benefits, the patient was                            deemed in satisfactory condition to undergo the                            procedure.                           After obtaining informed consent, the colonoscope                             was passed under direct vision. Throughout the                            procedure, the patient's blood pressure, pulse, and                            oxygen saturations were monitored continuously. The                            EC-[id number] ([id number]) scope was introduced through                            the anus and advanced to the the cecum, identified                            by appendiceal orifice and ileocecal valve. The                            colonoscopy was technically difficult and complex                            due to significant looping. The patient tolerated                            the procedure well. The quality of the bowel                            preparation was good. The appendiceal orifice and                            the rectum were photographed. Scope In: 10:27:37 AM Scope Out: 10:54:25 AM Scope Withdrawal Time: 0 hours 6 minutes 49 seconds  Total Procedure Duration: 0 hours 26 minutes 48 seconds  Findings:      The perianal and digital rectal examinations were normal.      The entire examined colon appeared normal. Left colonic diverticulosis  was present. Impression:               - The entire examined colon is normal.                           - No specimens collected. Moderate Sedation:      N/A- Per Anesthesia Care Recommendation:           - Patient has a contact number available for                            emergencies. The signs and symptoms of potential                            delayed complications were discussed with the                            patient. Return to normal activities tomorrow.                            Written discharge instructions were provided to the                            patient.                           - Repeat colonoscopy is not recommended for                            surveillance.                           - Resume previous diet.                           - Continue  present medications. Procedure Code(s):        --- Professional ---                           Z7673, Colorectal cancer screening; colonoscopy on                            individual at high risk Diagnosis Code(s):        --- Professional ---                           Z86.010, Personal history of colonic polyps CPT copyright 2016 [employer]. All rights reserved. The codes documented in this report are preliminary and upon coder review may  be revised to meet current compliance requirements. Earle Gell, MD Garlan Fair, MD 09/26/2016 10:59:19 AM This report has been signed electronically. Number of Addenda: 0

## 2016-09-26 NOTE — Anesthesia Preprocedure Evaluation (Addendum)
Anesthesia Evaluation  Patient identified by MRN, date of birth, ID band Patient awake    Reviewed: Allergy & Precautions, NPO status , Patient's Chart, lab work & pertinent test results  History of Anesthesia Complications Negative for: history of anesthetic complications  Airway Mallampati: II  TM Distance: >3 FB Neck ROM: Full    Dental  (+) Dental Advisory Given   Pulmonary former smoker,    breath sounds clear to auscultation       Cardiovascular hypertension, Pt. on medications (-) angina(-) CAD + dysrhythmias Atrial Fibrillation + Valvular Problems/Murmurs (s/p AVR, mod-severe Mitral stenosis)  Rhythm:Irregular Rate:Normal  '17 ECHO: EF 60% to 65%. Wall motion was normal;   there were no regional wall motion abnormalities. - Aortic valve: A bioprosthesis was present. - Mitral valve: The findings are consistent with moderate-severe stenosis.   Neuro/Psych dementia   GI/Hepatic negative GI ROS, Neg liver ROS,   Endo/Other  negative endocrine ROS  Renal/GU negative Renal ROS     Musculoskeletal   Abdominal   Peds  Hematology  (+) Blood dyscrasia (xarelto), ,   Anesthesia Other Findings   Reproductive/Obstetrics                            Anesthesia Physical Anesthesia Plan  ASA: III  Anesthesia Plan: MAC   Post-op Pain Management:    Induction: Intravenous  Airway Management Planned: Natural Airway  Additional Equipment:   Intra-op Plan:   Post-operative Plan:   Informed Consent: I have reviewed the patients History and Physical, chart, labs and discussed the procedure including the risks, benefits and alternatives for the proposed anesthesia with the patient or authorized representative who has indicated his/her understanding and acceptance.   Dental advisory given  Plan Discussed with: CRNA and Surgeon  Anesthesia Plan Comments: (Plan routine monitors, MAC)         Anesthesia Quick Evaluation

## 2016-09-26 NOTE — Transfer of Care (Signed)
Immediate Anesthesia Transfer of Care Note  Patient: Jerome Garrett  Procedure(s) Performed: Procedure(s): COLONOSCOPY WITH PROPOFOL (N/A)  Patient Location: PACU  Anesthesia Type:MAC  Level of Consciousness:  sedated, patient cooperative and responds to stimulation  Airway & Oxygen Therapy:Patient Spontanous Breathing and Patient connected to face mask oxgen  Post-op Assessment:  Report given to PACU RN and Post -op Vital signs reviewed and stable  Post vital signs:  Reviewed and stable  Last Vitals:  Vitals:   09/26/16 0930  BP: (!) 147/78  Resp: 12  Temp: 60.7 C    Complications: No apparent anesthesia complications

## 2016-09-26 NOTE — Anesthesia Postprocedure Evaluation (Signed)
Anesthesia Post Note  Patient: Jerome Garrett  Procedure(s) Performed: Procedure(s) (LRB): COLONOSCOPY WITH PROPOFOL (N/A)  Patient location during evaluation: Endoscopy Anesthesia Type: MAC Level of consciousness: awake and alert, oriented and patient cooperative Pain management: pain level controlled Vital Signs Assessment: post-procedure vital signs reviewed and stable Respiratory status: spontaneous breathing, nonlabored ventilation and respiratory function stable Cardiovascular status: blood pressure returned to baseline and stable Postop Assessment: no signs of nausea or vomiting Anesthetic complications: no       Last Vitals:  Vitals:   09/26/16 1120 09/26/16 1140  BP: 96/60 116/64  Pulse: 66   Resp: 16   Temp:      Last Pain:  Vitals:   09/26/16 1101  TempSrc: Oral                 Sundai Probert,E. Arnika Larzelere

## 2016-09-28 ENCOUNTER — Encounter (HOSPITAL_COMMUNITY): Payer: Self-pay | Admitting: Gastroenterology

## 2017-06-05 ENCOUNTER — Other Ambulatory Visit: Payer: Self-pay | Admitting: *Deleted

## 2017-06-05 MED ORDER — RIVAROXABAN 20 MG PO TABS: 20.0000 mg | ORAL_TABLET | Freq: Every day | ORAL | 1 refills | Status: AC

## 2017-09-23 IMAGING — MR MR HEAD W/O CM
7 series · 46 of 48 positions shown · non-contrast
Comparison: None.

CLINICAL DATA: 74-year-old male with confusion. Amnesia for 1 week.
Initial encounter.

Creatinine was obtained on site at [HOSPITAL] at [HOSPITAL].
Results: Creatinine 1.3 mg/dL.
EXAM:
MRI HEAD WITHOUT CONTRAST
TECHNIQUE: Multiplanar, multiecho pulse sequences of the brain and surrounding
structures were obtained without intravenous contrast.

[Series 2: T1 · sagittal · 5.0mm · 0.45mm/px · 3 of 20 slices shown]
[im 1/20]
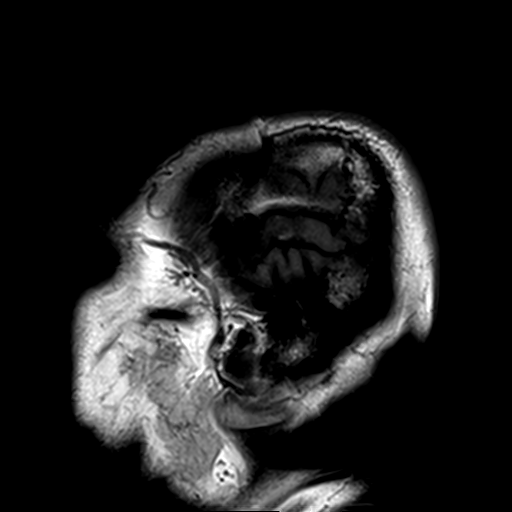
[im 10/20]
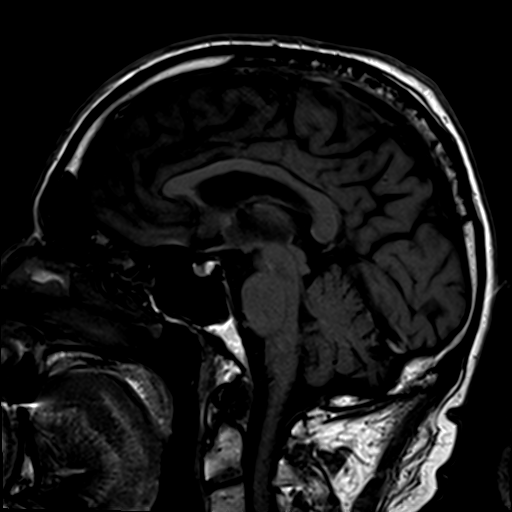
[im 20/20]
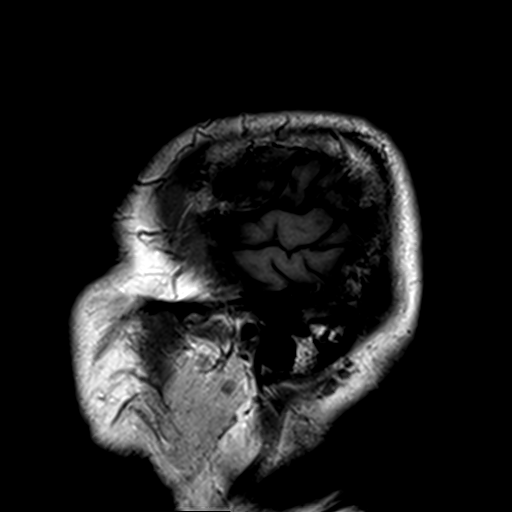

[Series 3: DWI · axial · 3.0mm · 1.80mm/px · z∈[-67,+80]mm · 13 of 99 slices shown (1 of 2)]
[im 1/99]
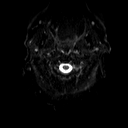
[im 9/99]
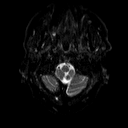
[im 17/99]
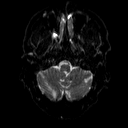
[im 25/99]
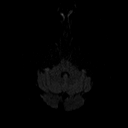
[im 33/99]
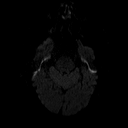
[im 41/99]
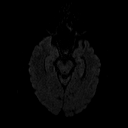
[im 50/99]
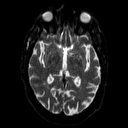
[im 58/99]
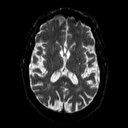
[im 66/99]
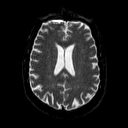
[im 74/99]
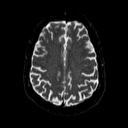
[im 82/99]
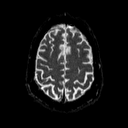
[im 90/99]
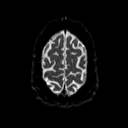
[im 99/99]
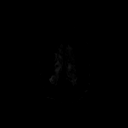

[Series 4: DWI · axial · 3.0mm · 1.80mm/px · z∈[-67,+80]mm · 6 of 50 slices shown (2 of 2)]
[im 1/50]
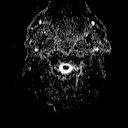
[im 10/50]
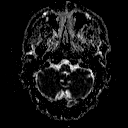
[im 20/50]
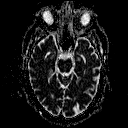
[im 30/50]
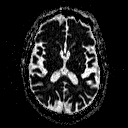
[im 40/50]
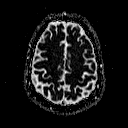
[im 50/50]
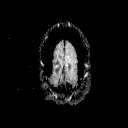

[Series 6: swi_images · axial · 2.0mm · 0.90mm/px · z∈[-73,+85]mm · 10 of 80 slices shown]
[im 1/80]
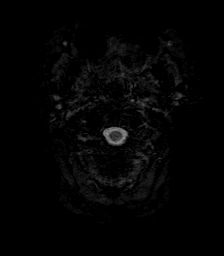
[im 9/80]
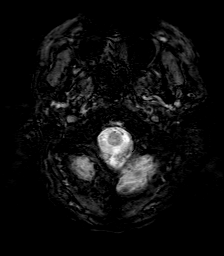
[im 18/80]
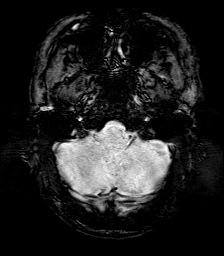
[im 27/80]
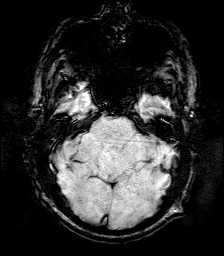
[im 36/80]
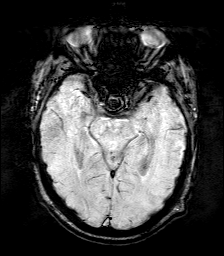
[im 44/80]
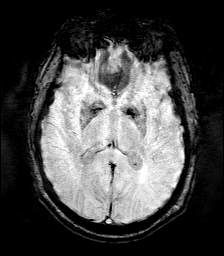
[im 53/80]
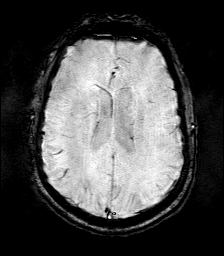
[im 62/80]
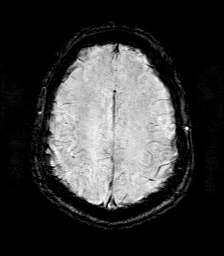
[im 71/80]
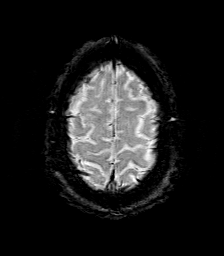
[im 80/80]
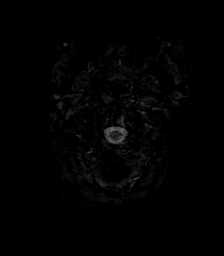

[Series 7: T2 · axial · 5.0mm · 0.45mm/px · z∈[-68,+81]mm · 3 of 24 slices shown]
[im 1/24]
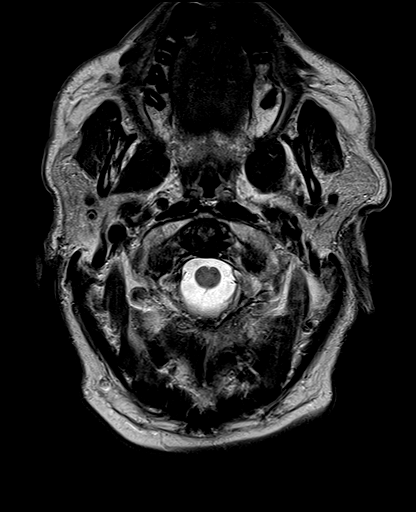
[im 12/24]
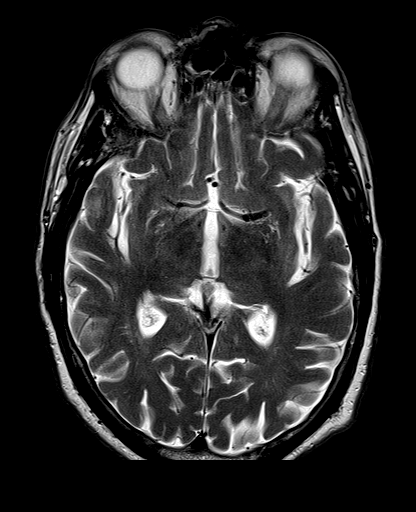
[im 24/24]
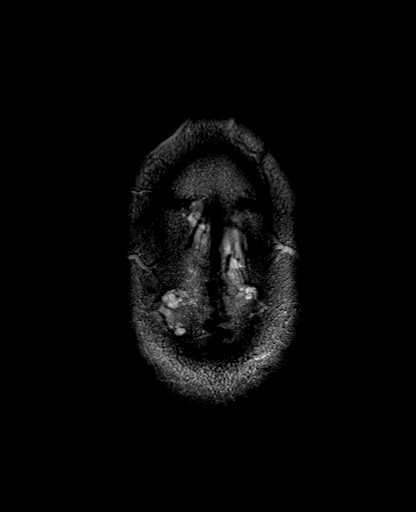

[Series 8: FLAIR · axial · 5.0mm · 0.45mm/px · z∈[-68,+81]mm · 3 of 24 slices shown]
[im 1/24]
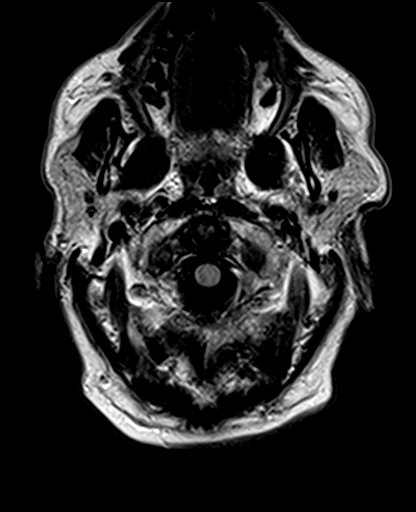
[im 12/24]
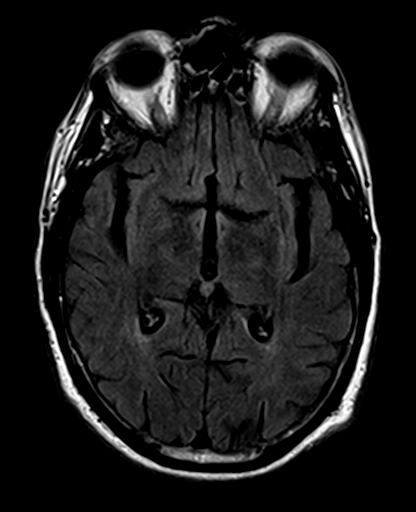
[im 24/24]
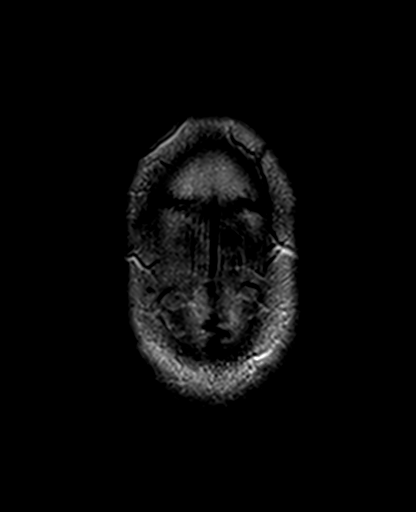

[Series 9: mpr tra · axial · 2.0mm · 0.45mm/px · z∈[-72,+86]mm · 8 of 80 slices shown]
[im 1/80]
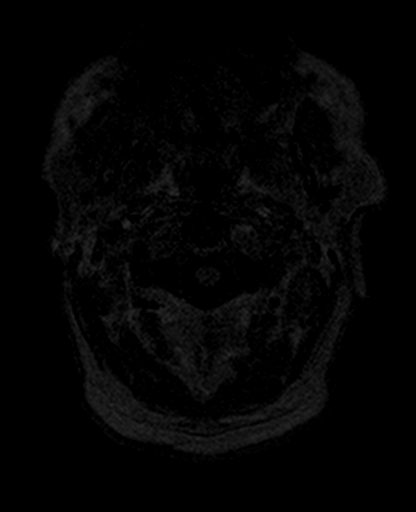
[im 9/80]
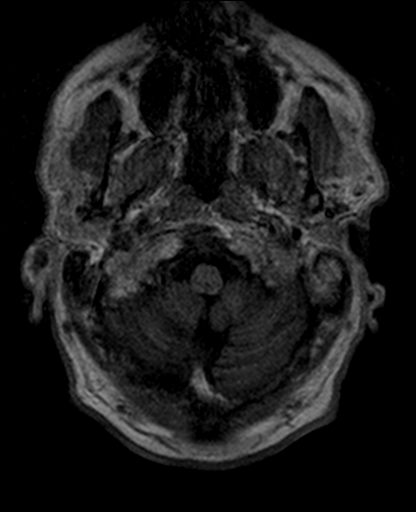
[im 27/80]
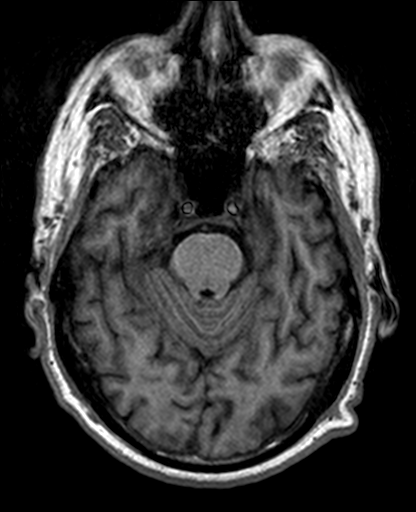
[im 36/80]
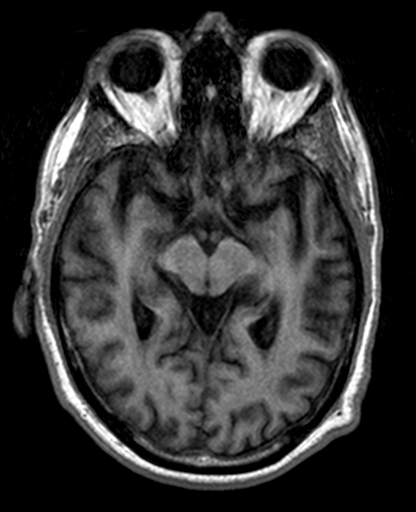
[im 44/80]
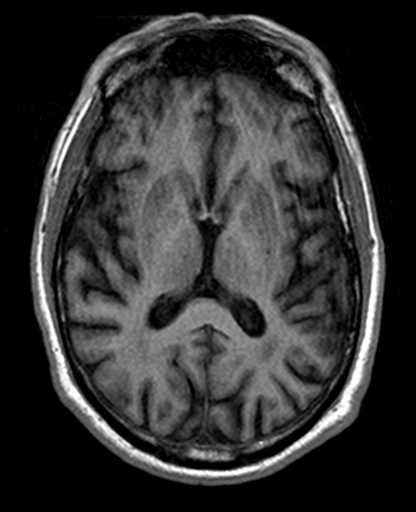
[im 53/80]
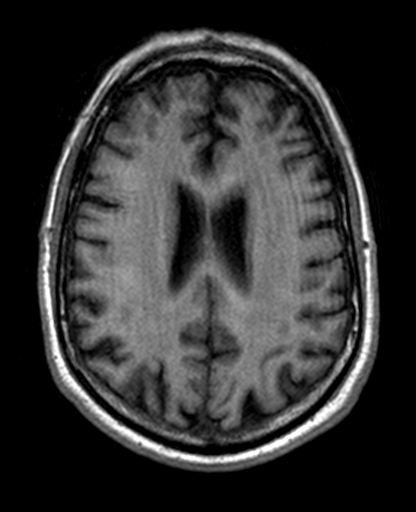
[im 71/80]
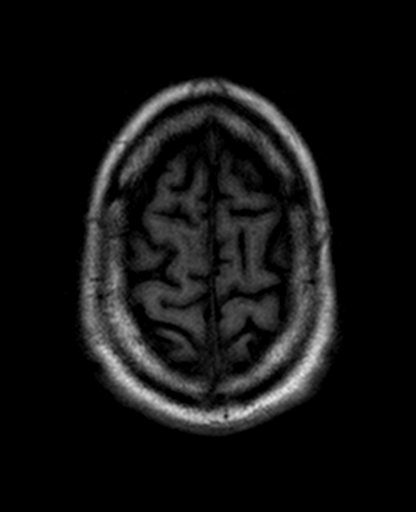
[im 80/80]
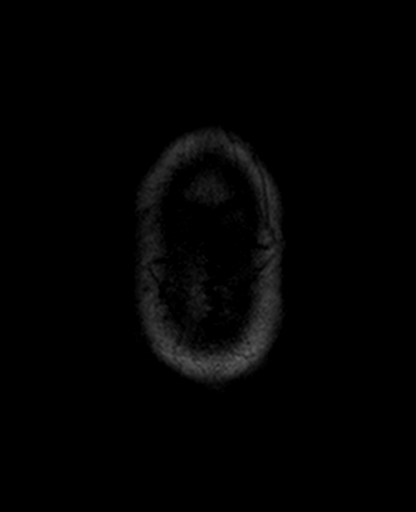

[46 of 48 positions shown; findings below may reference images not displayed]

FINDINGS: The examination had to be discontinued prior to completion due to
patient refusal to continue. Subsequently, post-contrast and coronal
T2 weighted imaging was not obtained.

Cerebral volume is within normal limits for age. No restricted
diffusion to suggest acute infarction. No midline shift, mass
effect, evidence of mass lesion, ventriculomegaly, extra-axial
collection or acute intracranial hemorrhage. Cervicomedullary
junction and pituitary are within normal limits. Negative visualized
cervical spine. Major intracranial vascular flow voids are within
normal limits.

Minimal to mild for age nonspecific mostly periventricular cerebral
white matter T2 and FLAIR hyperintensity. No cortical
encephalomalacia or chronic cerebral blood products are identified.
Mesial temporal lobe structures appear within normal limits. Deep
gray matter nuclei and brainstem are within normal limits. There are
several tiny chronic lacunar infarcts in both cerebellar
hemispheres.

Grossly normal visualized internal auditory structures. Mastoids are
clear. Mild paranasal sinus mucosal thickening. Small inferior right
maxillary sinus mucous retention cyst. Orbit and scalp soft tissues
are within normal limits. Normal bone marrow signal.
IMPRESSION: 1. No acute intracranial abnormality. No explanation for amnesia
identified.
2. Mild for age chronic small vessel disease.
3. The examination had to be discontinued prior to completion, and
subsequently postcontrast imaging was not obtained..

## 2018-01-15 ENCOUNTER — Other Ambulatory Visit: Payer: Self-pay | Admitting: Cardiology

## 2020-12-09 DEATH — deceased
